# Patient Record
Sex: Female | Born: 1995 | Race: White | Hispanic: No | Marital: Single | Smoking: Never smoker
Health system: Southern US, Community
[De-identification: ages and names within clinical notes are randomized; demographics above are authoritative.]

## PROBLEM LIST (undated history)

## (undated) DIAGNOSIS — S022XXA Fracture of nasal bones, initial encounter for closed fracture: Secondary | ICD-10-CM

## (undated) DIAGNOSIS — Z862 Personal history of diseases of the blood and blood-forming organs and certain disorders involving the immune mechanism: Secondary | ICD-10-CM

## (undated) DIAGNOSIS — J189 Pneumonia, unspecified organism: Secondary | ICD-10-CM

## (undated) DIAGNOSIS — Z8774 Personal history of (corrected) congenital malformations of heart and circulatory system: Secondary | ICD-10-CM

## (undated) DIAGNOSIS — J45909 Unspecified asthma, uncomplicated: Secondary | ICD-10-CM

## (undated) DIAGNOSIS — R011 Cardiac murmur, unspecified: Secondary | ICD-10-CM

## (undated) DIAGNOSIS — S0121XA Laceration without foreign body of nose, initial encounter: Secondary | ICD-10-CM

## (undated) DIAGNOSIS — J329 Chronic sinusitis, unspecified: Secondary | ICD-10-CM

## (undated) HISTORY — PX: WISDOM TOOTH EXTRACTION: SHX21

---
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
## 2015-01-09 ENCOUNTER — Encounter (HOSPITAL_COMMUNITY): Payer: Self-pay | Admitting: Emergency Medicine

## 2015-01-09 ENCOUNTER — Emergency Department (INDEPENDENT_AMBULATORY_CARE_PROVIDER_SITE_OTHER)
Admission: EM | Admit: 2015-01-09 | Discharge: 2015-01-09 | Disposition: A | Discharge status: Home / Self Care | Payer: Managed Care, Other (non HMO) | Source: Home / Self Care | Attending: Emergency Medicine | Admitting: Emergency Medicine

## 2015-01-09 DIAGNOSIS — B084 Enteroviral vesicular stomatitis with exanthem: Secondary | ICD-10-CM | POA: Diagnosis not present

## 2015-01-09 LAB — CBC WITH DIFFERENTIAL/PLATELET
BASOS ABS: 0 10*3/uL (ref 0.0–0.1)
Basophils Relative: 0 % (ref 0–1)
EOS ABS: 0 10*3/uL (ref 0.0–0.7)
Eosinophils Relative: 0 % (ref 0–5)
HCT: 41.1 % (ref 36.0–46.0)
Hemoglobin: 13.7 g/dL (ref 12.0–15.0)
LYMPHS PCT: 11 % — AB (ref 12–46)
Lymphs Abs: 0.8 10*3/uL (ref 0.7–4.0)
MCH: 29.8 pg (ref 26.0–34.0)
MCHC: 33.3 g/dL (ref 30.0–36.0)
MCV: 89.5 fL (ref 78.0–100.0)
MONOS PCT: 4 % (ref 3–12)
Monocytes Absolute: 0.3 10*3/uL (ref 0.1–1.0)
Neutro Abs: 6 10*3/uL (ref 1.7–7.7)
Neutrophils Relative %: 85 % — ABNORMAL HIGH (ref 43–77)
PLATELETS: 277 10*3/uL (ref 150–400)
RBC: 4.59 MIL/uL (ref 3.87–5.11)
RDW: 12.5 % (ref 11.5–15.5)
WBC: 7 10*3/uL (ref 4.0–10.5)

## 2015-01-09 LAB — COMPREHENSIVE METABOLIC PANEL
ALK PHOS: 67 U/L (ref 38–126)
ALT: 15 U/L (ref 14–54)
ANION GAP: 12 (ref 5–15)
AST: 25 U/L (ref 15–41)
Albumin: 4.1 g/dL (ref 3.5–5.0)
BUN: 11 mg/dL (ref 6–20)
CHLORIDE: 100 mmol/L — AB (ref 101–111)
CO2: 27 mmol/L (ref 22–32)
Calcium: 9.8 mg/dL (ref 8.9–10.3)
Creatinine, Ser: 0.85 mg/dL (ref 0.44–1.00)
Glucose, Bld: 142 mg/dL — ABNORMAL HIGH (ref 65–99)
POTASSIUM: 4.3 mmol/L (ref 3.5–5.1)
SODIUM: 139 mmol/L (ref 135–145)
TOTAL PROTEIN: 7.9 g/dL (ref 6.5–8.1)
Total Bilirubin: 0.3 mg/dL (ref 0.3–1.2)

## 2015-01-09 LAB — POCT URINALYSIS DIP (DEVICE)
Bilirubin Urine: NEGATIVE
Glucose, UA: NEGATIVE mg/dL
KETONES UR: NEGATIVE mg/dL
LEUKOCYTES UA: NEGATIVE
NITRITE: NEGATIVE
Protein, ur: NEGATIVE mg/dL
SPECIFIC GRAVITY, URINE: 1.02 (ref 1.005–1.030)
Urobilinogen, UA: 1 mg/dL (ref 0.0–1.0)
pH: 6.5 (ref 5.0–8.0)

## 2015-01-09 LAB — POCT PREGNANCY, URINE: PREG TEST UR: NEGATIVE

## 2015-01-09 MED ORDER — LIDOCAINE VISCOUS 2 % MT SOLN: OROMUCOSAL | Status: DC

## 2015-01-09 NOTE — ED Notes (Signed)
Pt comes in with c/o pimple like rash all over with spreading that started yesterday  Pt was seen by PCP yesterday, treated with Atb's,Prednisone and Tylenol liquid for Tonsillitis  Seen by ENT yesterday as well Co sore throat,poor appetite, with chills and fever at home  Itchiness noted  Rapid strep negative @ doctors office

## 2015-01-09 NOTE — Discharge Instructions (Signed)
This is likely a virus, probably hand-foot-and-mouth. Please stop the Augmentin and prednisone. Use Benadryl as needed for itching. Use the viscous lidocaine as prescribed. Make sure you are drinking plenty of fluids. If you are unable to drink fluids or you develop a very stiff and painful neck, please go to the emergency room.  Hand, Foot, and Mouth Disease Hand, foot, and mouth disease is a common viral illness. It occurs mainly in children younger than 19 years of age, but adolescents and adults may also get it. This disease is different than foot and mouth disease that cattle, sheep, and pigs get. Most people are better in 1 week. CAUSES  Hand, foot, and mouth disease is usually caused by a group of viruses called enteroviruses. Hand, foot, and mouth disease can spread from person to person (contagious). A person is most contagious during the first week of the illness. It is not transmitted to or from pets or other animals. It is most common in the summer and early fall. Infection is spread from person to person by direct contact with an infected person's:  Nose discharge.  Throat discharge.  Stool. SYMPTOMS  Open sores (ulcers) occur in the mouth. Symptoms may also include:  A rash on the hands and feet, and occasionally the buttocks.  Fever.  Aches.  Pain from the mouth ulcers.  Fussiness. DIAGNOSIS  Hand, foot, and mouth disease is one of many infections that cause mouth sores. To be certain your child has hand, foot, and mouth disease your caregiver will diagnose your child by physical exam.Additional tests are not usually needed. TREATMENT  Nearly all patients recover without medical treatment in 7 to 10 days. There are no common complications. Your child should only take over-the-counter or prescription medicines for pain, discomfort, or fever as directed by your caregiver. Your caregiver may recommend the use of an over-the-counter antacid or a combination of an antacid  and diphenhydramine to help coat the lesions in the mouth and improve symptoms.  HOME CARE INSTRUCTIONS  Try combinations of foods to see what your child will tolerate and aim for a balanced diet. Soft foods may be easier to swallow. The mouth sores from hand, foot, and mouth disease typically hurt and are painful when exposed to salty, spicy, or acidic food or drinks.  Milk and cold drinks are soothing for some patients. Milk shakes, frozen ice pops, slushies, and sherberts are usually well tolerated.  Sport drinks are good choices for hydration, and they also provide a few calories. Often, a child with hand, foot, and mouth disease will be able to drink without discomfort.   For younger children and infants, feeding with a cup, spoon, or syringe may be less painful than drinking through the nipple of a bottle.  Keep children out of childcare programs, schools, or other group settings during the first few days of the illness or until they are without fever. The sores on the body are not contagious. SEEK IMMEDIATE MEDICAL CARE IF:  Your child develops signs of dehydration such as:  Decreased urination.  Dry mouth, tongue, or lips.  Decreased tears or sunken eyes.  Dry skin.  Rapid breathing.  Fussy behavior.  Poor color or pale skin.  Fingertips taking longer than 2 seconds to turn pink after a gentle squeeze.  Rapid weight loss.  Your child does not have adequate pain relief.  Your child develops a severe headache, stiff neck, or change in behavior.  Your child develops ulcers or blisters that  occur on the lips or outside of the mouth. Document Released: 04/30/2003 Document Revised: 10/24/2011 Document Reviewed: 01/13/2011 [facility] Patient Information 2015 [place], [place]. This information is not intended to replace advice given to you by your health care provider. Make sure you discuss any questions you have with your health care provider.

## 2015-01-09 NOTE — ED Provider Notes (Signed)
CSN: [id number]     Arrival date & time 01/09/15  1905 History   First MD Initiated Contact with Patient 01/09/15 1931     Chief Complaint  Patient presents with  . Sore Throat  . Rash   (Consider location/radiation/quality/duration/timing/severity/associated sxs/prior Treatment) HPI  She is an 19 year old woman here with her mother for evaluation of sore throat and rash. Her symptoms started 2 days ago with severe sore throat. She has also had fevers up to 102. She was seen by her primary care doctor yesterday. A rapid strep test was negative at that office. They referred her to her ear nose and throat specialist who treated her with Augmentin and prednisone. Today, she reports continued sore throat. It is bad enough that she is not eating or drinking very much. This has caused some lightheadedness and headache. No syncopal episodes. She has also developed a rash. The rash started yesterday, prior to any medications. Initially it was on her arm, but now involves hands, arms, legs, feet, face, and back. It is somewhat itchy. No stiff neck. She reports nausea, but no abdominal pain, vomiting, diarrhea. Fever responds well to Tylenol.  She has a history of mono.  Past Medical History  Diagnosis Date  . Asthma     prn inhaler  . History of ventricular septal defect     closed prior to age 89, per mother  . Heart murmur     functional murmur, per echo 04/2006  . Sinus infection 03/26/2014    will finish antibiotic 03/30/2014  . History of anemia     no current med.  . Closed fracture nasal bone 03/22/2014  . Laceration of nose 03/22/2014    x 3 - sutured   Past Surgical History  Procedure Laterality Date  . Closed reduction nasal fracture N/A 03/31/2014    Procedure: CLOSED REDUCTION NASAL FRACTURE;  Surgeon: Glenna FellowsBrinda Thimmappa, MD;  Location: [facility];  Service: Plastics;  Laterality: N/A;   Family History  Problem Relation Age of Onset  . Hypertension Maternal Grandmother   .  Congestive Heart Failure Maternal Grandmother   . Asthma Maternal Grandmother   . Stroke Maternal Grandmother   . Hypertension Paternal Grandmother    History  Substance Use Topics  . Smoking status: Never Smoker   . Smokeless tobacco: Never Used  . Alcohol Use: No   OB History    No data available     Review of Systems As in history of present illness Allergies  Review of patient's allergies indicates no known allergies.  Home Medications   Prior to Admission medications   Medication Sig Start Date End Date Taking? Authorizing Provider  acetaminophen (TYLENOL) 325 MG tablet Take 650 mg by mouth every 6 (six) hours as needed.    Historical Provider, MD  albuterol (PROVENTIL HFA;VENTOLIN HFA) 108 (90 BASE) MCG/ACT inhaler Inhale 1 puff into the lungs every 6 (six) hours as needed for wheezing or shortness of breath.    Historical Provider, MD  ibuprofen (ADVIL,MOTRIN) 200 MG tablet Take 200 mg by mouth every 6 (six) hours as needed.    Historical Provider, MD  lidocaine (XYLOCAINE) 2 % solution Swish and gargle with 5mLs for 10-20 seconds, then spit.  You can do this every 3-4 hours as needed for mouth pain. 01/09/15   Charm RingsErin J Elaijah Munoz, MD  norgestimate-ethinyl estradiol (ORTHO-CYCLEN,SPRINTEC,PREVIFEM) 0.25-35 MG-MCG tablet Take 1 tablet by mouth daily.    Historical Provider, MD   BP 127/78 mmHg  Pulse 64  Temp(Src) 96.6 F (35.9 C) (Oral)  Resp 16  SpO2 100%  LMP 01/06/2015 Physical Exam  Constitutional: She is oriented to person, place, and time. She appears well-developed and well-nourished. No distress.  HENT:  Mouth/Throat: No oropharyngeal exudate.  She has several shallow ulcerations on the posterior pharynx, primarily on the tonsillar pillars.  She has significant erythema of the tonsils and posterior pharynx.  Eyes: Conjunctivae are normal.  Neck: Neck supple.  Cardiovascular: Normal rate and regular rhythm.   Murmur (known history of murmur) heard. Pulmonary/Chest:  Effort normal and breath sounds normal. No respiratory distress. She has no wheezes. She has no rales.  Abdominal: Soft. Bowel sounds are normal. She exhibits no distension. There is no tenderness. There is no rebound and no guarding.  Lymphadenopathy:    She has no cervical adenopathy.  Neurological: She is alert and oriented to person, place, and time.  Skin:  Diffuse erythematous papules and vesicles on hands, feet, arms, legs. She has several spots on her nose.    ED Course  Procedures (including critical care time) Labs Review Labs Reviewed  POCT URINALYSIS DIP (DEVICE) - Abnormal; Notable for the following:    Hgb urine dipstick MODERATE (*)    All other components within normal limits  CBC WITH DIFFERENTIAL/PLATELET  COMPREHENSIVE METABOLIC PANEL  POCT PREGNANCY, URINE    Imaging Review No results found.   MDM   1. Hand, foot and mouth disease    History and exam is most consistent with hand-foot-and-mouth disease. Recommended stopping the antibiotic-impregnated prednisone. Viscous lidocaine for mouth pain. Benadryl as needed for itching. Discussed importance of fluid intake. She is having some symptoms of mild dehydration, however her mucosa is moist and her urine specific gravity is 1.020. A CBC and CMP were collected. I will call with results. Return precautions reviewed as in after visit summary.    Charm Rings, MD 01/09/15 2006

## 2015-07-04 ENCOUNTER — Emergency Department (HOSPITAL_COMMUNITY): Payer: Managed Care, Other (non HMO)

## 2015-07-04 ENCOUNTER — Encounter (HOSPITAL_COMMUNITY): Payer: Self-pay | Admitting: Emergency Medicine

## 2015-07-04 ENCOUNTER — Emergency Department (HOSPITAL_COMMUNITY)
Admission: EM | Admit: 2015-07-04 | Discharge: 2015-07-04 | Disposition: A | Discharge status: Home / Self Care | Payer: Managed Care, Other (non HMO) | Attending: Emergency Medicine | Admitting: Emergency Medicine

## 2015-07-04 DIAGNOSIS — Z8679 Personal history of other diseases of the circulatory system: Secondary | ICD-10-CM | POA: Diagnosis not present

## 2015-07-04 DIAGNOSIS — Z7951 Long term (current) use of inhaled steroids: Secondary | ICD-10-CM | POA: Diagnosis not present

## 2015-07-04 DIAGNOSIS — Z8701 Personal history of pneumonia (recurrent): Secondary | ICD-10-CM | POA: Insufficient documentation

## 2015-07-04 DIAGNOSIS — J45901 Unspecified asthma with (acute) exacerbation: Secondary | ICD-10-CM | POA: Diagnosis not present

## 2015-07-04 DIAGNOSIS — R011 Cardiac murmur, unspecified: Secondary | ICD-10-CM | POA: Insufficient documentation

## 2015-07-04 DIAGNOSIS — Z862 Personal history of diseases of the blood and blood-forming organs and certain disorders involving the immune mechanism: Secondary | ICD-10-CM | POA: Insufficient documentation

## 2015-07-04 DIAGNOSIS — Z8781 Personal history of (healed) traumatic fracture: Secondary | ICD-10-CM | POA: Insufficient documentation

## 2015-07-04 DIAGNOSIS — Z87828 Personal history of other (healed) physical injury and trauma: Secondary | ICD-10-CM | POA: Insufficient documentation

## 2015-07-04 DIAGNOSIS — Z79899 Other long term (current) drug therapy: Secondary | ICD-10-CM | POA: Insufficient documentation

## 2015-07-04 DIAGNOSIS — R05 Cough: Secondary | ICD-10-CM | POA: Diagnosis present

## 2015-07-04 HISTORY — DX: Pneumonia, unspecified organism: J18.9

## 2015-07-04 MED ORDER — ALBUTEROL SULFATE (2.5 MG/3ML) 0.083% IN NEBU
5.0000 mg | INHALATION_SOLUTION | Freq: Once | RESPIRATORY_TRACT | Status: AC
  Administered 2015-07-04: 5 mg via RESPIRATORY_TRACT
  Filled 2015-07-04: qty 6

## 2015-07-04 MED ORDER — ALBUTEROL (5 MG/ML) CONTINUOUS INHALATION SOLN
10.0000 mg/h | INHALATION_SOLUTION | RESPIRATORY_TRACT | Status: DC
  Administered 2015-07-04: 10 mg/h via RESPIRATORY_TRACT
  Filled 2015-07-04: qty 20

## 2015-07-04 MED ORDER — BENZONATATE 100 MG PO CAPS: 100.0000 mg | ORAL_CAPSULE | Freq: Three times a day (TID) | ORAL | Status: DC | PRN

## 2015-07-04 MED ORDER — ALBUTEROL SULFATE (2.5 MG/3ML) 0.083% IN NEBU: 2.5000 mg | INHALATION_SOLUTION | Freq: Four times a day (QID) | RESPIRATORY_TRACT | Status: AC | PRN

## 2015-07-04 MED ORDER — IPRATROPIUM BROMIDE 0.02 % IN SOLN
0.5000 mg | Freq: Once | RESPIRATORY_TRACT | Status: AC
  Administered 2015-07-04: 0.5 mg via RESPIRATORY_TRACT
  Filled 2015-07-04: qty 2.5

## 2015-07-04 MED ORDER — DEXAMETHASONE 10 MG/ML FOR PEDIATRIC ORAL USE
10.0000 mg | Freq: Once | INTRAMUSCULAR | Status: AC
  Administered 2015-07-04: 10 mg via ORAL
  Filled 2015-07-04: qty 1

## 2015-07-04 NOTE — Discharge Instructions (Signed)
Asthma Attack Prevention °While you may not be able to control the fact that you have asthma, you can take actions to prevent asthma attacks. The best way to prevent asthma attacks is to maintain good control of your asthma. You can achieve this by: °· Taking your medicines as directed. °· Avoiding things that can irritate your airways or make your asthma symptoms worse (asthma triggers). °· Keeping track of how well your asthma is controlled and of any changes in your symptoms. °· Responding quickly to worsening asthma symptoms (asthma attack). °· Seeking emergency care when it is needed. °WHAT ARE SOME WAYS TO PREVENT AN ASTHMA ATTACK? °Have a Plan °Work with your health care provider to create a written plan for managing and treating your asthma attacks (asthma action plan). This plan includes: °· A list of your asthma triggers and how you can avoid them. °· Information on when medicines should be taken and when their dosages should be changed. °· The use of a device that measures how well your lungs are working (peak flow meter). °Monitor Your Asthma °Use your peak flow meter and record your results in a journal every day. A drop in your peak flow numbers on one or more days may indicate the start of an asthma attack. This can happen even before you start to feel symptoms. You can prevent an asthma attack from getting worse by following the steps in your asthma action plan. °Avoid Asthma Triggers °Work with your asthma health care provider to find out what your asthma triggers are. This can be done by: °· Allergy testing. °· Keeping a journal that notes when asthma attacks occur and the factors that may have contributed to them. °· Determining if there are other medical conditions that are making your asthma worse. °Once you have determined your asthma triggers, take steps to avoid them. This may include avoiding excessive or prolonged exposure to: °· Dust. Have someone dust and vacuum your home for you once or  twice a week. Using a high-efficiency particulate arrestance (HEPA) vacuum is best. °· Smoke. This includes campfire smoke, forest fire smoke, and secondhand smoke from tobacco products. °· Pet dander. Avoid contact with animals that you know you are allergic to. °· Allergens from trees, grasses or pollens. Avoid spending a lot of time outdoors when pollen counts are high, and on very windy days. °· Very cold, dry, or humid air. °· Mold. °· Foods that contain high amounts of sulfites. °· Strong odors. °· Outdoor air pollutants, such as engine exhaust. °· Indoor air pollutants, such as aerosol sprays and fumes from household cleaners. °· Household pests, including dust mites and cockroaches, and pest droppings. °· Certain medicines, including NSAIDs. Always talk to your health care provider before stopping or starting any new medicines. °Medicines °Take over-the-counter and prescription medicines only as told by your health care provider. Many asthma attacks can be prevented by carefully following your medicine schedule. Taking your medicines correctly is especially important when you cannot avoid certain asthma triggers. °Act Quickly °If an asthma attack does happen, acting quickly can decrease how severe it is and how long it lasts. Take these steps:  °· Pay attention to your symptoms. If you are coughing, wheezing, or having difficulty breathing, do not wait to see if your symptoms go away on their own. Follow your asthma action plan. °· If you have followed your asthma action plan and your symptoms are not improving, call your health care provider or seek immediate medical care   at the nearest hospital. °It is important to note how often you need to use your fast-acting rescue inhaler. If you are using your rescue inhaler more often, it may mean that your asthma is not under control. Adjusting your asthma treatment plan may help you to prevent future asthma attacks and help you to gain better control of your  condition. °HOW CAN I PREVENT AN ASTHMA ATTACK WHEN I EXERCISE? °Follow advice from your health care provider about whether you should use your fast-acting inhaler before exercising. Many people with asthma experience exercise-induced bronchoconstriction (EIB). This condition often worsens during vigorous exercise in cold, humid, or dry environments. Usually, people with EIB can stay very active by pre-treating with a fast-acting inhaler before exercising. °  °This information is not intended to replace advice given to you by your health care provider. Make sure you discuss any questions you have with your health care provider. °  °Document Released: 07/20/2009 Document Revised: 04/22/2015 Document Reviewed: 01/01/2015 °Elsevier Interactive Patient Education ©2016 Else[employer] Inc. ° °Asthma, Acute Bronchospasm °Acute bronchospasm caused by asthma is also referred to as an asthma attack. Bronchospasm means your air passages become narrowed. The narrowing is caused by inflammation and tightening of the muscles in the air tubes (bronchi) in your lungs. This can make it hard to breathe or cause you to wheeze and cough. °CAUSES °Possible triggers are: °· Animal dander from the skin, hair, or feathers of animals. °· Dust mites contained in house dust. °· Cockroaches. °· Pollen from trees or grass. °· Mold. °· Cigarette or tobacco smoke. °· Air pollutants such as dust, household cleaners, hair sprays, aerosol sprays, paint fumes, strong chemicals, or strong odors. °· Cold air or weather changes. Cold air may trigger inflammation. Winds increase molds and pollens in the air. °· Strong emotions such as crying or laughing hard. °· Stress. °· Certain medicines such as aspirin or beta-blockers. °· Sulfites in foods and drinks, such as dried fruits and wine. °· Infections or inflammatory conditions, such as a flu, cold, or inflammation of the nasal membranes (rhinitis). °· Gastroesophageal reflux disease (GERD). GERD is a condition  where stomach acid backs up into your esophagus. °· Exercise or strenuous activity. °SIGNS AND SYMPTOMS  °· Wheezing. °· Excessive coughing, particularly at night. °· Chest tightness. °· Shortness of breath. °DIAGNOSIS  °Your health care provider will ask you about your medical history and perform a physical exam. A chest X-ray or blood testing may be performed to look for other causes of your symptoms or other conditions that may have triggered your asthma attack.  °TREATMENT  °Treatment is aimed at reducing inflammation and opening up the airways in your lungs.  Most asthma attacks are treated with inhaled medicines. These include quick relief or rescue medicines (such as bronchodilators) and controller medicines (such as inhaled corticosteroids). These medicines are sometimes given through an inhaler or a nebulizer. Systemic steroid medicine taken by mouth or given through an IV tube also can be used to reduce the inflammation when an attack is moderate or severe. Antibiotic medicines are only used if a bacterial infection is present.  °HOME CARE INSTRUCTIONS  °· Rest. °· Drink plenty of liquids. This helps the mucus to remain thin and be easily coughed up. Only use caffeine in moderation and do not use alcohol until you have recovered from your illness. °· Do not smoke. Avoid being exposed to secondhand smoke. °· You play a critical role in keeping yourself in good health. Avoid exposure to things that   cause you to wheeze or to have breathing problems.  Keep your medicines up-to-date and available. Carefully follow your health care provider's treatment plan.  Take your medicine exactly as prescribed.  When pollen or pollution is bad, keep windows closed and use an air conditioner or go to places with air conditioning.  Asthma requires careful medical care. See your health care provider for a follow-up as advised. If you are more than [redacted] weeks pregnant and you were prescribed any new medicines, let your  obstetrician know about the visit and how you are doing. Follow up with your health care provider as directed.  After you have recovered from your asthma attack, make an appointment with your outpatient doctor to talk about ways to reduce the likelihood of future attacks. If you do not have a doctor who manages your asthma, make an appointment with a primary care doctor to discuss your asthma. SEEK IMMEDIATE MEDICAL CARE IF:   You are getting worse.  You have trouble breathing. If severe, call your local emergency services (911 in the [place]  You develop chest pain or discomfort.  You are vomiting.  You are not able to keep fluids down.  You are coughing up yellow, green, brown, or bloody sputum.  You have a fever and your symptoms suddenly get worse.  You have trouble swallowing. MAKE SURE YOU:   Understand these instructions.  Will watch your condition.  Will get help right away if you are not doing well or get worse.   This information is not intended to replace advice given to you by your health care provider. Make sure you discuss any questions you have with your health care provider.   Document Released: 11/16/2006 Document Revised: 08/06/2013 Document Reviewed: 02/06/2013 Elsevier Interactive Patient Education 2016 Else[employer] Inc[employer] Acute Bronchitis Bronchitis is inflammation of the airways that extend from the windpipe into the lungs (bronchi). The inflammation often causes mucus to develop. This leads to a cough, which is the most common symptom of bronchitis.  In acute bronchitis, the condition usually develops suddenly and goes away over time, usually in a couple weeks. Smoking, allergies, and asthma can make bronchitis worse. Repeated episodes of bronchitis may cause further lung problems.  CAUSES Acute bronchitis is most often caused by the same virus that causes a cold. The virus can spread from person to person (contagious) through coughing, sneezing, and touching  contaminated objects. SIGNS AND SYMPTOMS   Cough.   Fever.   Coughing up mucus.   Body aches.   Chest congestion.   Chills.   Shortness of breath.   Sore throat.  DIAGNOSIS  Acute bronchitis is usually diagnosed through a physical exam. Your health care provider will also ask you questions about your medical history. Tests, such as chest X-rays, are sometimes done to rule out other conditions.  TREATMENT  Acute bronchitis usually goes away in a couple weeks. Oftentimes, no medical treatment is necessary. Medicines are sometimes given for relief of fever or cough. Antibiotic medicines are usually not needed but may be prescribed in certain situations. In some cases, an inhaler may be recommended to help reduce shortness of breath and control the cough. A cool mist vaporizer may also be used to help thin bronchial secretions and make it easier to clear the chest.  HOME CARE INSTRUCTIONS  Get plenty of rest.   Drink enough fluids to keep your urine clear or pale yellow (unless you have a medical condition that requires fluid restriction). Increasing fluids may  help thin your respiratory secretions (sputum) and reduce chest congestion, and it will prevent dehydration.   Take medicines only as directed by your health care provider.  If you were prescribed an antibiotic medicine, finish it all even if you start to feel better.  Avoid smoking and secondhand smoke. Exposure to cigarette smoke or irritating chemicals will make bronchitis worse. If you are a smoker, consider using nicotine gum or skin patches to help control withdrawal symptoms. Quitting smoking will help your lungs heal faster.   Reduce the chances of another bout of acute bronchitis by washing your hands frequently, avoiding people with cold symptoms, and trying not to touch your hands to your mouth, nose, or eyes.   Keep all follow-up visits as directed by your health care provider.  SEEK MEDICAL CARE  IF: Your symptoms do not improve after 1 week of treatment.  SEEK IMMEDIATE MEDICAL CARE IF:  You develop an increased fever or chills.   You have chest pain.   You have severe shortness of breath.  You have bloody sputum.   You develop dehydration.  You faint or repeatedly feel like you are going to pass out.  You develop repeated vomiting.  You develop a severe headache. MAKE SURE YOU:   Understand these instructions.  Will watch your condition.  Will get help right away if you are not doing well or get worse.   This information is not intended to replace advice given to you by your health care provider. Make sure you discuss any questions you have with your health care provider.   Document Released: 09/08/2004 Document Revised: 08/22/2014 Document Reviewed: 01/22/2013 Elsevier Interactive Patient Education [employer] Inc[employer]

## 2015-07-04 NOTE — ED Notes (Addendum)
Pt c/o shortness, dizziness, wheezing, productive cough. Tx 7 days ago with prednisone,singular Advair.Albuterol."CXR= bronchitis". Z-pack started 4 days ago. Hx of recurrent asthma, followed by recurrent pneumonia

## 2015-07-04 NOTE — ED Notes (Signed)
RN called respiratory for nebulizer tx

## 2015-07-04 NOTE — ED Provider Notes (Signed)
CSN: [id number]     Arrival date & time 07/04/15  1316 History   First MD Initiated Contact with Patient 07/04/15 1323     Chief Complaint  Patient presents with  . Shortness of Breath    10 day hx  . Asthma    hx  . Dizziness  . Cough   Jaime Wiggins is a 19 y.o. female with a history of asthma, and pneumonia who presents to the emergency department complaining of cough, chest pain, wheezing, shortness of breath and productive cough for the past week and a half. Patient was seen at her student health clinic about a week ago and was treated with prednisone, albuterol inhaler and Advair. Patient was diagnosed with bronchitis. She reports she continued to feel worse and was started on a Z-Pak 4 days ago. She has 2 doses left of her Z-Pak. Patient reports she completed prednisone 4 days ago. Patient has been taking albuterol inhaler every 4 hours. Also has Advair,  And azithromycin. She reports he is taking Singulair at night. She reports she still having productive cough with white sputum. She reports runny nose, nasal congestion, postnasal drip sneezing as well. Patient reports chills but no fevers. The patient denies abdominal pain, vomiting, diarrhea, body aches, rashes, syncope.  (Consider location/radiation/quality/duration/timing/severity/associated sxs/prior Treatment) HPI  Past Medical History  Diagnosis Date  . Asthma     prn inhaler  . History of ventricular septal defect     closed prior to age 19, per mother  . Heart murmur     functional murmur, per echo 04/2006  . Sinus infection 03/26/2014    will finish antibiotic 03/30/2014  . History of anemia     no current med.  . Closed fracture nasal bone 03/22/2014  . Laceration of nose 03/22/2014    x 3 - sutured  . Pneumonia    Past Surgical History  Procedure Laterality Date  . Closed reduction nasal fracture N/A 03/31/2014    Procedure: CLOSED REDUCTION NASAL FRACTURE;  Surgeon: Glenna FellowsBrinda Thimmappa, MD;  Location: [facility];  Service: Plastics;  Laterality: N/A;  . Wisdom tooth extraction     Family History  Problem Relation Age of Onset  . Hypertension Maternal Grandmother   . Congestive Heart Failure Maternal Grandmother   . Asthma Maternal Grandmother   . Stroke Maternal Grandmother   . Hypertension Paternal Grandmother   . Asthma Mother    Social History  Substance Use Topics  . Smoking status: Never Smoker   . Smokeless tobacco: Never Used  . Alcohol Use: No   OB History    No data available     Review of Systems  Constitutional: Positive for chills. Negative for fever.  HENT: Positive for congestion, postnasal drip, rhinorrhea, sneezing and sore throat. Negative for ear pain, nosebleeds and trouble swallowing.   Eyes: Negative for visual disturbance.  Respiratory: Positive for cough, chest tightness, shortness of breath and wheezing.   Cardiovascular: Positive for chest pain. Negative for palpitations and leg swelling.  Gastrointestinal: Negative for vomiting, abdominal pain and diarrhea.  Musculoskeletal: Negative for myalgias, arthralgias and neck pain.  Skin: Negative for rash and wound.  Neurological: Negative for syncope, weakness and headaches.      Allergies  Review of patient's allergies indicates no known allergies.  Home Medications   Prior to Admission medications   Medication Sig Start Date End Date Taking? Authorizing Provider  albuterol (PROVENTIL HFA;VENTOLIN HFA) 108 (90 BASE) MCG/ACT inhaler Inhale 1  puff into the lungs every 6 (six) hours as needed for wheezing or shortness of breath.   Yes Historical Provider, MD  azithromycin (ZITHROMAX) 250 MG tablet Take 250-500 mg by mouth. Zpak 5 day instructions.   Yes Historical Provider, MD  FALMINA 0.1-20 MG-MCG tablet Take 1 tablet by mouth at bedtime. 06/17/15  Yes Historical Provider, MD  Fluticasone-Salmeterol (ADVAIR) 250-50 MCG/DOSE AEPB Inhale 1 puff into the lungs 2 (two) times daily.   Yes Historical  Provider, MD  montelukast (SINGULAIR) 10 MG tablet Take 10 mg by mouth at bedtime.   Yes Historical Provider, MD  acetaminophen (TYLENOL) 325 MG tablet Take 650 mg by mouth every 6 (six) hours as needed.    Historical Provider, MD  albuterol (PROVENTIL) (2.5 MG/3ML) 0.083% nebulizer solution Take 3 mLs (2.5 mg total) by nebulization every 6 (six) hours as needed for wheezing or shortness of breath. 07/04/15   Everlene Farrier, PA-C  benzonatate (TESSALON) 100 MG capsule Take 1 capsule (100 mg total) by mouth 3 (three) times daily as needed for cough. 07/04/15   Everlene Farrier, PA-C   BP 132/85 mmHg  Pulse 119  Temp(Src) 98.2 F (36.8 C) (Oral)  Resp 16  SpO2 100%  LMP 06/27/2015 (Exact Date) Physical Exam  Constitutional: She is oriented to person, place, and time. She appears well-developed and well-nourished. No distress.  Nontoxic appearing.  HENT:  Head: Normocephalic and atraumatic.  Right Ear: External ear normal.  Left Ear: External ear normal.  Mouth/Throat: Oropharynx is clear and moist. No oropharyngeal exudate.  Boggy nasal turbinates bilaterally.Mild bilateral tonsillar hypertrophy without exudates. No posterior oral pharyngeal edema. Uvula is midline without edema. No peritonsillar abscess. Bilateral tympanic membranes are pearly-gray without erythema or loss of landmarks.   Eyes: Conjunctivae are normal. Pupils are equal, round, and reactive to light. Right eye exhibits no discharge. Left eye exhibits no discharge.  Neck: Normal range of motion. Neck supple. No JVD present. No tracheal deviation present.  Cardiovascular: Normal rate, regular rhythm, normal heart sounds and intact distal pulses.  Exam reveals no gallop and no friction rub.   Heart rate is 96.  Pulmonary/Chest: Effort normal. No respiratory distress. She has wheezes. She has no rales. She exhibits tenderness.  Patient has wheezing in her bilateral bases worse on her left side. No respiratory distress. Patient  speaking in complete sentences. Symmetric chest expansion bilaterally. Patient's substernal chest is tender to palpation reproduces her chest pain.  Abdominal: Soft. She exhibits no distension. There is no tenderness. There is no guarding.  Musculoskeletal: She exhibits no edema or tenderness.  No lower extremity edema or tenderness.  Lymphadenopathy:    She has no cervical adenopathy.  Neurological: She is alert and oriented to person, place, and time. Coordination normal.  Skin: Skin is warm and dry. No rash noted. She is not diaphoretic. No erythema. No pallor.  Psychiatric: She has a normal mood and affect. Her behavior is normal.  Nursing note and vitals reviewed.   ED Course  Procedures (including critical care time) Labs Review Labs Reviewed - No data to display  Imaging Review Dg Chest 2 View  07/04/2015  CLINICAL DATA:  Shortness of breath, dizziness and wheezing. Productive cough EXAM: CHEST  2 VIEW COMPARISON:  9/14/8 FINDINGS: The heart size and mediastinal contours are within normal limits. Both lungs are clear. The visualized skeletal structures are unremarkable. IMPRESSION: No active cardiopulmonary disease. Electronically Signed   By: Signa Kell M.D.   On: 07/04/2015 14:14  I have personally reviewed and evaluated these images as part of my medical decision-making.   EKG Interpretation None      Filed Vitals:   07/04/15 1324 07/04/15 1536 07/04/15 1541  BP: 130/76 132/85   Pulse: 95 119   Temp:  98.2 F (36.8 C)   TempSrc: Oral Oral   Resp:  16   SpO2: 98% 99% 100%     MDM   Meds given in ED:  Medications  albuterol (PROVENTIL,VENTOLIN) solution continuous neb (10 mg/hr Nebulization New Bag/Given 07/04/15 1636)  albuterol (PROVENTIL) (2.5 MG/3ML) 0.083% nebulizer solution 5 mg (5 mg Nebulization Given 07/04/15 1325)  ipratropium (ATROVENT) nebulizer solution 0.5 mg (0.5 mg Nebulization Given 07/04/15 1636)  dexamethasone (DECADRON) 10 MG/ML  injection for Pediatric ORAL use 10 mg (10 mg Oral Given 07/04/15 1608)    New Prescriptions   ALBUTEROL (PROVENTIL) (2.5 MG/3ML) 0.083% NEBULIZER SOLUTION    Take 3 mLs (2.5 mg total) by nebulization every 6 (six) hours as needed for wheezing or shortness of breath.   BENZONATATE (TESSALON) 100 MG CAPSULE    Take 1 capsule (100 mg total) by mouth 3 (three) times daily as needed for cough.    Final diagnoses:  Asthma exacerbation   This is a 19 y.o. female with a history of asthma, and pneumonia who presents to the emergency department complaining of cough, chest pain, wheezing, shortness of breath and productive cough for the past week and a half. Patient was seen at her student health clinic about a week ago and was treated with prednisone, albuterol inhaler and Advair. Patient was diagnosed with bronchitis. She reports she continued to feel worse and was started on a Z-Pak 4 days ago. She has 2 doses left of her Z-Pak. Patient reports she completed prednisone 4 days ago. On exam the patient is afebrile nontoxic appearing. She has wheezes noted to her bilateral bases. She is in no respiratory distress. Her chest is tender to palpation and reproduces her chest pain. I doubt PE as she has symptoms of asthma exacerbation with associated reproducible chest pain. Patient also has an oxygen saturation 100% on room air. Patient's chest x-ray is unremarkable. Patient is provided with initial albuterol treatment initially. Patient is still feeling slightly short of breath. Will provide with continuous nebulization treatment as well as Decadron orally. I re-evaluation patient reports she is feeling much better. She reports she no longer feels short of breath and feels she is ready for discharge. Repeat lung exam is much improved and there is no wheezing noted on exam. We'll discharge with prescription for refill on her albuterol nebulizer solution as well as Tessalon Perles for cough. I encouraged close  follow-up by primary care and advised strict return precautions. I advised the patient to follow-up with their primary care provider this week. I advised the patient to return to the emergency department with new or worsening symptoms or new concerns. The patient and her mother verbalized understanding and agreement with plan.     Everlene Farrier, PA-C 07/04/15 1759  Benjiman Core, MD 07/04/15 [phone]

## 2015-07-08 ENCOUNTER — Other Ambulatory Visit (HOSPITAL_COMMUNITY): Payer: Self-pay | Admitting: Plastic Surgery

## 2015-07-08 DIAGNOSIS — J3489 Other specified disorders of nose and nasal sinuses: Secondary | ICD-10-CM

## 2015-07-08 DIAGNOSIS — R0689 Other abnormalities of breathing: Secondary | ICD-10-CM

## 2015-08-03 ENCOUNTER — Ambulatory Visit (HOSPITAL_COMMUNITY): Admission: RE | Admit: 2015-08-03 | Payer: Managed Care, Other (non HMO) | Source: Ambulatory Visit

## 2015-08-09 ENCOUNTER — Emergency Department (HOSPITAL_COMMUNITY)
Admission: EM | Admit: 2015-08-09 | Discharge: 2015-08-10 | Disposition: A | Discharge status: Home / Self Care | Payer: Managed Care, Other (non HMO) | Attending: Emergency Medicine | Admitting: Emergency Medicine

## 2015-08-09 ENCOUNTER — Encounter (HOSPITAL_COMMUNITY): Payer: Self-pay | Admitting: Emergency Medicine

## 2015-08-09 DIAGNOSIS — J039 Acute tonsillitis, unspecified: Secondary | ICD-10-CM | POA: Diagnosis not present

## 2015-08-09 DIAGNOSIS — Z3202 Encounter for pregnancy test, result negative: Secondary | ICD-10-CM | POA: Diagnosis not present

## 2015-08-09 DIAGNOSIS — Q210 Ventricular septal defect: Secondary | ICD-10-CM | POA: Diagnosis not present

## 2015-08-09 DIAGNOSIS — Z79899 Other long term (current) drug therapy: Secondary | ICD-10-CM | POA: Diagnosis not present

## 2015-08-09 DIAGNOSIS — R011 Cardiac murmur, unspecified: Secondary | ICD-10-CM | POA: Insufficient documentation

## 2015-08-09 DIAGNOSIS — Z87828 Personal history of other (healed) physical injury and trauma: Secondary | ICD-10-CM | POA: Insufficient documentation

## 2015-08-09 DIAGNOSIS — Z7951 Long term (current) use of inhaled steroids: Secondary | ICD-10-CM | POA: Diagnosis not present

## 2015-08-09 DIAGNOSIS — Z862 Personal history of diseases of the blood and blood-forming organs and certain disorders involving the immune mechanism: Secondary | ICD-10-CM | POA: Insufficient documentation

## 2015-08-09 DIAGNOSIS — Z8781 Personal history of (healed) traumatic fracture: Secondary | ICD-10-CM | POA: Insufficient documentation

## 2015-08-09 DIAGNOSIS — A084 Viral intestinal infection, unspecified: Secondary | ICD-10-CM | POA: Diagnosis not present

## 2015-08-09 DIAGNOSIS — J45909 Unspecified asthma, uncomplicated: Secondary | ICD-10-CM | POA: Insufficient documentation

## 2015-08-09 DIAGNOSIS — M549 Dorsalgia, unspecified: Secondary | ICD-10-CM | POA: Insufficient documentation

## 2015-08-09 DIAGNOSIS — Z792 Long term (current) use of antibiotics: Secondary | ICD-10-CM | POA: Insufficient documentation

## 2015-08-09 DIAGNOSIS — R1084 Generalized abdominal pain: Secondary | ICD-10-CM | POA: Diagnosis present

## 2015-08-09 LAB — COMPREHENSIVE METABOLIC PANEL
ALBUMIN: 4.2 g/dL (ref 3.5–5.0)
ALK PHOS: 55 U/L (ref 38–126)
ALT: 15 U/L (ref 14–54)
AST: 23 U/L (ref 15–41)
Anion gap: 12 (ref 5–15)
BUN: 11 mg/dL (ref 6–20)
CALCIUM: 9.1 mg/dL (ref 8.9–10.3)
CHLORIDE: 100 mmol/L — AB (ref 101–111)
CO2: 22 mmol/L (ref 22–32)
CREATININE: 0.78 mg/dL (ref 0.44–1.00)
GFR calc non Af Amer: >60 mL/min (ref >60)
GLUCOSE: 123 mg/dL — AB (ref 65–99)
Potassium: 3.5 mmol/L (ref 3.5–5.1)
SODIUM: 134 mmol/L — AB (ref 135–145)
Total Bilirubin: 1.1 mg/dL (ref 0.3–1.2)
Total Protein: 7.6 g/dL (ref 6.5–8.1)

## 2015-08-09 LAB — CBC
HEMATOCRIT: 42.9 % (ref 36.0–46.0)
HEMOGLOBIN: 14.6 g/dL (ref 12.0–15.0)
MCH: 30 pg (ref 26.0–34.0)
MCHC: 34 g/dL (ref 30.0–36.0)
MCV: 88.3 fL (ref 78.0–100.0)
Platelets: 278 10*3/uL (ref 150–400)
RBC: 4.86 MIL/uL (ref 3.87–5.11)
RDW: 12.3 % (ref 11.5–15.5)
WBC: 6.9 10*3/uL (ref 4.0–10.5)

## 2015-08-09 LAB — URINALYSIS, ROUTINE W REFLEX MICROSCOPIC
Bilirubin Urine: NEGATIVE
Glucose, UA: NEGATIVE mg/dL
HGB URINE DIPSTICK: NEGATIVE
KETONES UR: NEGATIVE mg/dL
Leukocytes, UA: NEGATIVE
Nitrite: NEGATIVE
PROTEIN: NEGATIVE mg/dL
Specific Gravity, Urine: 1.005 (ref 1.005–1.030)
pH: 6 (ref 5.0–8.0)

## 2015-08-09 LAB — I-STAT BETA HCG BLOOD, ED (MC, WL, AP ONLY)

## 2015-08-09 LAB — LIPASE, BLOOD: LIPASE: 28 U/L (ref 11–51)

## 2015-08-09 MED ORDER — ONDANSETRON HCL 4 MG/2ML IJ SOLN
4.0000 mg | Freq: Once | INTRAMUSCULAR | Status: AC
  Administered 2015-08-09: 4 mg via INTRAVENOUS
  Filled 2015-08-09: qty 2

## 2015-08-09 MED ORDER — ONDANSETRON 4 MG PO TBDP: 4.0000 mg | ORAL_TABLET | Freq: Once | ORAL | Status: DC | PRN

## 2015-08-09 MED ORDER — MORPHINE SULFATE (PF) 4 MG/ML IV SOLN
4.0000 mg | Freq: Once | INTRAVENOUS | Status: AC
  Administered 2015-08-09: 4 mg via INTRAVENOUS
  Filled 2015-08-09: qty 1

## 2015-08-09 NOTE — ED Provider Notes (Signed)
CSN: [id number]     Arrival date & time 08/09/15  2028 History   First MD Initiated Contact with Patient 08/09/15 2109     Chief Complaint  Patient presents with  . Abdominal Pain     (Consider location/radiation/quality/duration/timing/severity/associated sxs/prior Treatment) HPI   Patient is a 19 year old female past medical history of asthma who presents to the ED with complaint of abdominal pain, onset last night. Patient reports having worsening constant aching/sharp diffuse abdominal pain, she notes the pain is worse in her right lower quadrant. Endorses associated chills, fever, myalgias, neck/back pain, weakness, nausea, NBNB vomiting and diarrhea. Patient endorses seen a small amount of blood in her bowel movement. Patient reports she was seen at the clinic on campus at her college week ago and was diagnosed with tonsillitis but notes she did not take her full course of antibiotics. She notes she started to have a sore throat again 3 days ago. Denies dysphasia, drooling, muffled voice, facial/neck swelling. Patient states she has tried taking Tums and Imodium at home but has been unable to keep medicine down due to multiple episodes of vomiting. Denies headache, visual changes, photophobia, cough, SOB, CP, urinary sxs, vaginal bleeding, vaginal d/c, numbness, tingling. Denies any sick contacts. LMP 4 weeks ago. Denies hx of abdominal surgeries.   Past Medical History  Diagnosis Date  . Asthma     prn inhaler  . History of ventricular septal defect     closed prior to age 19, per mother  . Heart murmur     functional murmur, per echo 04/2006  . Sinus infection 03/26/2014    will finish antibiotic 03/30/2014  . History of anemia     no current med.  . Closed fracture nasal bone 03/22/2014  . Laceration of nose 03/22/2014    x 3 - sutured  . Pneumonia    Past Surgical History  Procedure Laterality Date  . Closed reduction nasal fracture N/A 03/31/2014    Procedure: CLOSED REDUCTION  NASAL FRACTURE;  Surgeon: Glenna FellowsBrinda Thimmappa, MD;  Location: [facility];  Service: Plastics;  Laterality: N/A;  . Wisdom tooth extraction     Family History  Problem Relation Age of Onset  . Hypertension Maternal Grandmother   . Congestive Heart Failure Maternal Grandmother   . Asthma Maternal Grandmother   . Stroke Maternal Grandmother   . Hypertension Paternal Grandmother   . Asthma Mother    Social History  Substance Use Topics  . Smoking status: Never Smoker   . Smokeless tobacco: Never Used  . Alcohol Use: No   OB History    No data available     Review of Systems  Constitutional: Positive for chills and fatigue.  HENT: Positive for sore throat.   Gastrointestinal: Positive for nausea, vomiting, abdominal pain, diarrhea and blood in stool.  Musculoskeletal: Positive for myalgias, back pain and neck pain.  Neurological: Positive for weakness.  All other systems reviewed and are negative.     Allergies  Review of patient's allergies indicates no known allergies.  Home Medications   Prior to Admission medications   Medication Sig Start Date End Date Taking? Authorizing Provider  acetaminophen (TYLENOL) 325 MG tablet Take 650 mg by mouth every 6 (six) hours as needed for mild pain, moderate pain or headache.    Yes Historical Provider, MD  albuterol (PROVENTIL HFA;VENTOLIN HFA) 108 (90 BASE) MCG/ACT inhaler Inhale 1 puff into the lungs every 6 (six) hours as needed for wheezing or shortness of  breath.   Yes Historical Provider, MD  albuterol (PROVENTIL) (2.5 MG/3ML) 0.083% nebulizer solution Take 3 mLs (2.5 mg total) by nebulization every 6 (six) hours as needed for wheezing or shortness of breath. 07/04/15  Yes Everlene Farrier, PA-C  cephALEXin (KEFLEX) 500 MG capsule Take 500 mg by mouth 3 (three) times daily.   Yes Historical Provider, MD  FALMINA 0.1-20 MG-MCG tablet Take 1 tablet by mouth at bedtime. 06/17/15  Yes Historical Provider, MD   Fluticasone-Salmeterol (ADVAIR) 250-50 MCG/DOSE AEPB Inhale 1 puff into the lungs 2 (two) times daily.   Yes Historical Provider, MD  montelukast (SINGULAIR) 10 MG tablet Take 10 mg by mouth at bedtime.   Yes Historical Provider, MD  amoxicillin (AMOXIL) 500 MG capsule Take 2 capsules (1,000 mg total) by mouth daily. 08/10/15   Barrett Henle, PA-C  benzonatate (TESSALON) 100 MG capsule Take 1 capsule (100 mg total) by mouth 3 (three) times daily as needed for cough. Patient not taking: Reported on 08/09/2015 07/04/15   Everlene Farrier, PA-C  ondansetron (ZOFRAN ODT) 4 MG disintegrating tablet Take 1 tablet (4 mg total) by mouth every 8 (eight) hours as needed for nausea or vomiting. 08/10/15   Satira Sark Oval Cavazos, PA-C   BP 119/72 mmHg  Pulse 94  Temp(Src) 98.9 F (37.2 C) (Oral)  Resp 18  Ht  (1.575 m)  Wt 58.514 kg  BMI 23.59 kg/m2  SpO2 98%  LMP 07/15/2015 Physical Exam  Constitutional: She is oriented to person, place, and time. She appears well-developed and well-nourished.  HENT:  Head: Normocephalic and atraumatic.  Right Ear: Tympanic membrane normal.  Left Ear: Tympanic membrane normal.  Nose: Nose normal.  Mouth/Throat: Uvula is midline. Mucous membranes are dry. Posterior oropharyngeal erythema present. No oropharyngeal exudate or posterior oropharyngeal edema.  Eyes: Conjunctivae and EOM are normal. Pupils are equal, round, and reactive to light. Right eye exhibits no discharge. Left eye exhibits no discharge. No scleral icterus.  Neck: Normal range of motion. Neck supple.  Cardiovascular: Normal rate, regular rhythm and intact distal pulses.   Murmur heard.  Systolic murmur is present  Pulmonary/Chest: Effort normal and breath sounds normal. No respiratory distress. She has no wheezes. She has no rales. She exhibits no tenderness.  Abdominal: Soft. Bowel sounds are normal. She exhibits no distension and no mass. There is tenderness (mild diffuse  abdominal tenderness, worse in epigastric region). There is no rebound and no guarding.  Musculoskeletal: Normal range of motion. She exhibits no edema.  Lymphadenopathy:    She has no cervical adenopathy.  Neurological: She is alert and oriented to person, place, and time.  Skin: Skin is warm and dry. No rash noted.  Nursing note and vitals reviewed.   ED Course  Procedures (including critical care time) Labs Review Labs Reviewed  COMPREHENSIVE METABOLIC PANEL - Abnormal; Notable for the following:    Sodium 134 (*)    Chloride 100 (*)    Glucose, Bld 123 (*)    All other components within normal limits  RAPID STREP SCREEN (NOT AT [facility])  GASTROINTESTINAL PANEL BY PCR, STOOL (REPLACES STOOL CULTURE)  CULTURE, GROUP A STREP  LIPASE, BLOOD  CBC  URINALYSIS, ROUTINE W REFLEX MICROSCOPIC (NOT AT [facility])  I-STAT BETA HCG BLOOD, ED ([facility], [facility], [facility] ONLY)    Imaging Review No results found. I have personally reviewed and evaluated these images and lab results as part of my medical decision-making.   EKG Interpretation None  MDM   Final diagnoses:  Viral gastroenteritis  Tonsillitis    Pt presents with sore throat that started 3 days ago and abdominal pain, N/V/D and myalgias that started last night. She has been unable to tolerate PO at home. Denies sick contacts. She was dx with tonsillitis 1 week ago at campus clinic and reports to not completing her antibiotic course. VSS. Exam revealed dry mucous membranes, erythematous peritonsillar region, mild diffuse abdominal pain, no peritoneal signs and systolic murmur. Pt reports being dx with systolic murmur years ago and notes she had a negative workup by pediatric cardiology. No trismus, drooling, neck swelling or stridor on exam. Pt given IVF, pain meds and zofran. Labs and UA unremarkable.   Dr. Ranae Palms evaluated pt. No meningeal signs on exam. Will order rapid strep due to hx of sore thraot and erythematous tonsils. Pt reports  she is feeling much better and her N/V and abdominal pain have significantly improved. She denies having any bowel movements while in the ED. Pt has been able to tolerate PO. Pt requesting to be d/c home. Advised pt that her rapid strep is pending however pt and mother still requesting to go home. Due to pt's hx of recent tonsillitis without finishing antibiotic and presenting today with erythematous tonsils will d/c pt home with amoxicillin to tx for tonsillitis and zofran for nausea. Discussed strict return precautions with pt. Advised pt to follow up with PCP.    Satira Sark [place], [place] 08/10/15 0454  Loren Racer, MD 08/19/15 1600

## 2015-08-09 NOTE — ED Notes (Signed)
Pt presents with abd pain, low back pain, emesis and diarrhea onset last night, dizziness and weakness.

## 2015-08-10 LAB — RAPID STREP SCREEN (MED CTR MEBANE ONLY): STREPTOCOCCUS, GROUP A SCREEN (DIRECT): NEGATIVE

## 2015-08-10 MED ORDER — AMOXICILLIN 500 MG PO CAPS: 1000.0000 mg | ORAL_CAPSULE | Freq: Every day | ORAL | Status: AC

## 2015-08-10 MED ORDER — ONDANSETRON 4 MG PO TBDP: 4.0000 mg | ORAL_TABLET | Freq: Three times a day (TID) | ORAL | Status: DC | PRN

## 2015-08-10 NOTE — Discharge Instructions (Signed)
Take your medications as prescribed. Continue drinking fluids to remain hydrated. Follow-up with your primary care provider in 3-4 days. Return to the emergency department if symptoms worsen or new onset of fever, headache, neck stiffness, productive cough, difficulty breathing, chest pain, vomiting blood, blood in stool, numbness, tingling, weakness, syncope, seizure.

## 2015-08-13 LAB — CULTURE, GROUP A STREP: Strep A Culture: NEGATIVE

## 2016-06-29 HISTORY — PX: ANKLE SURGERY: SHX546

## 2016-11-24 IMAGING — US US PELVIS LIMITED
1 series · 12 of 12 positions shown · non-contrast
Comparison: None.

CLINICAL DATA: Palpable mass left lower back at L5 level

EXAM:
LIMITED ULTRASOUND OF ABDOMINAL SOFT TISSUES
TECHNIQUE: Ultrasound examination of the abdominal wall soft tissues was
performed in the area of clinical concern.

[Series 1: us pelvis limited · 0.07mm/px · 12 of 12 slices shown]
[im 1/12]
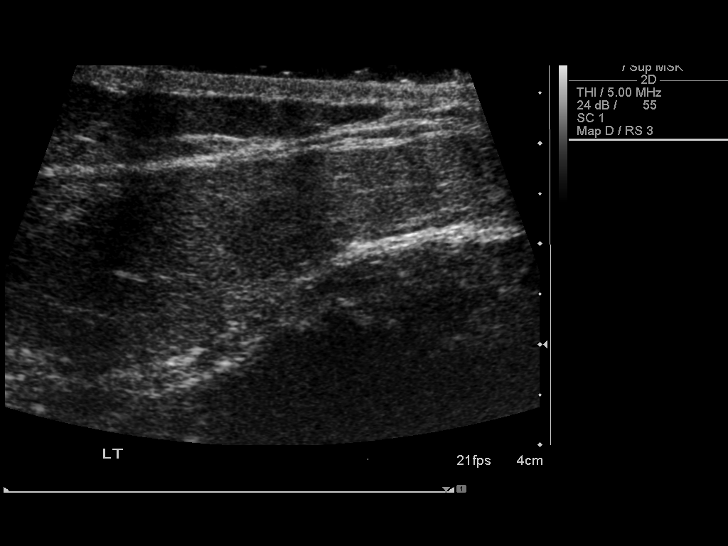
[im 2/12]
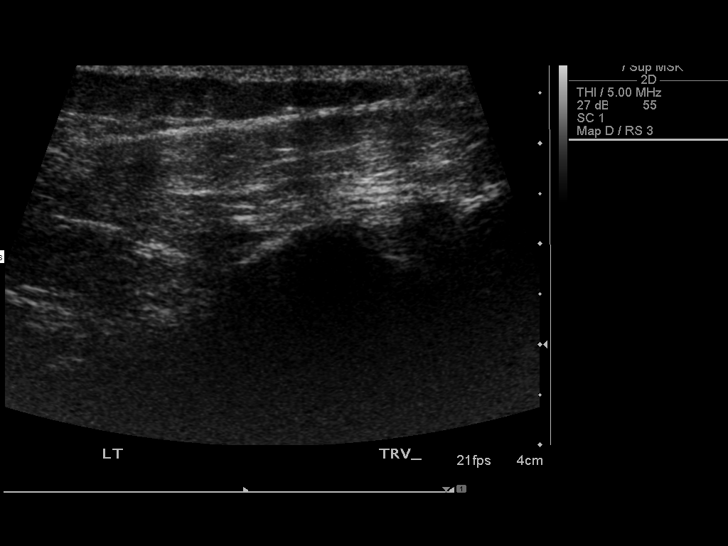
[im 3/12]
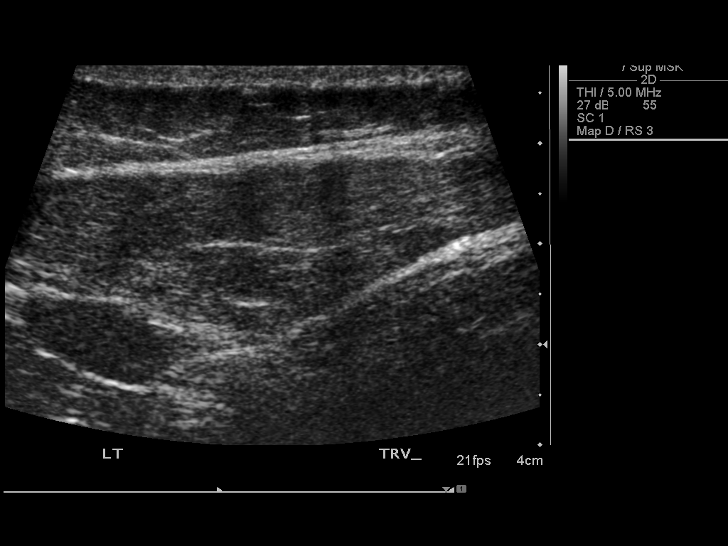
[im 4/12]
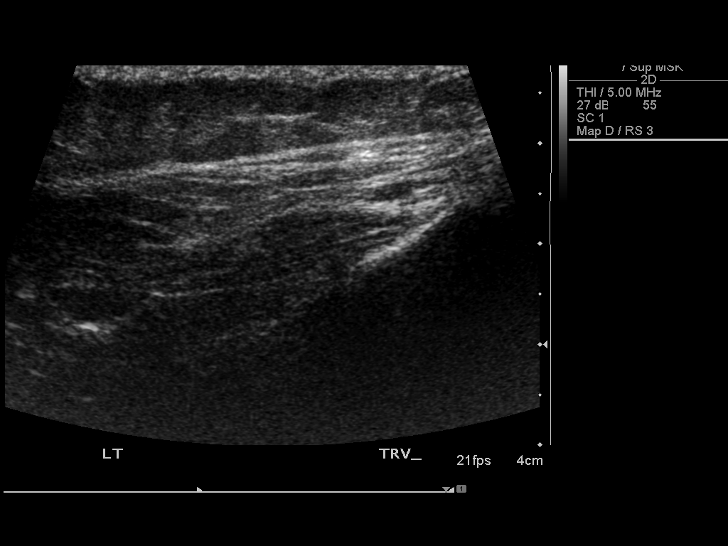
[im 5/12]
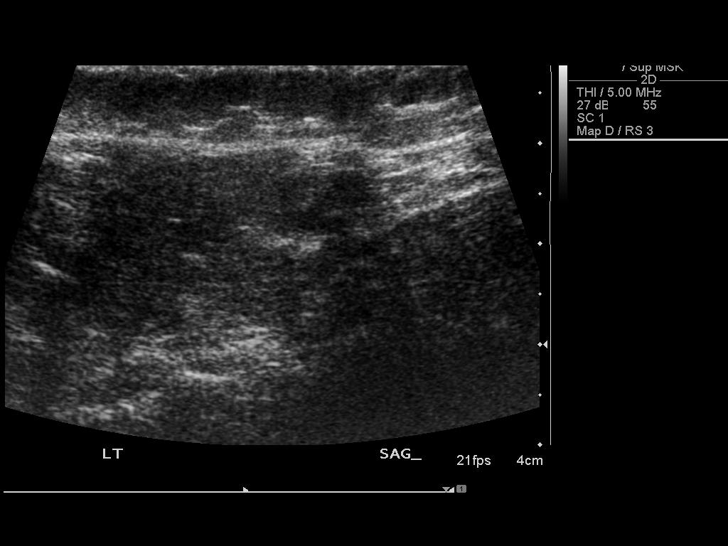
[im 6/12]
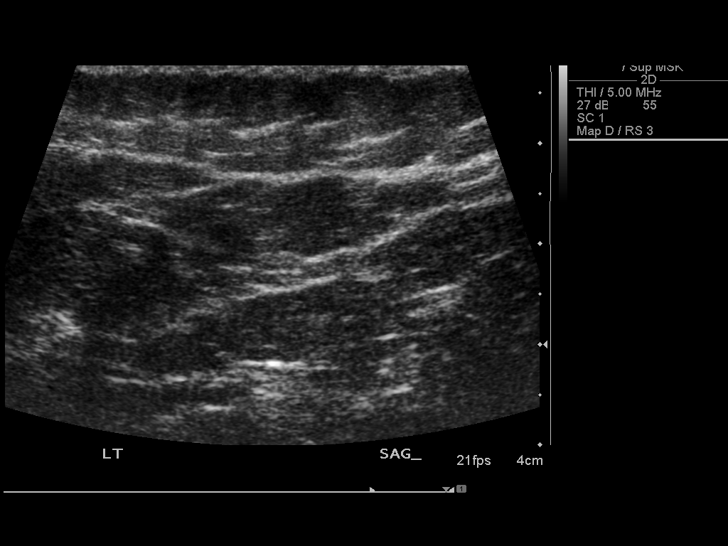
[im 7/12]
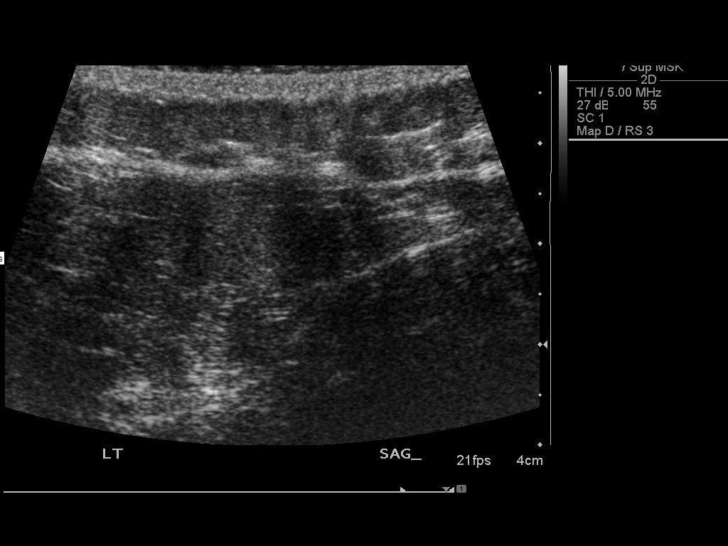
[im 8/12]
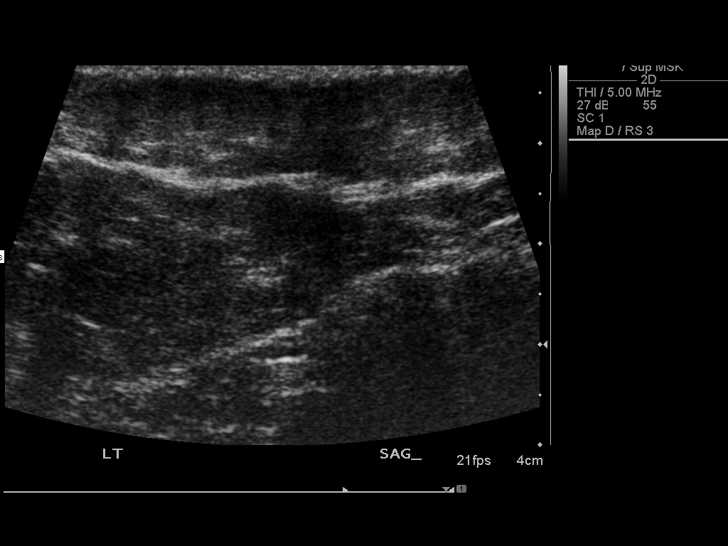
[im 9/12]
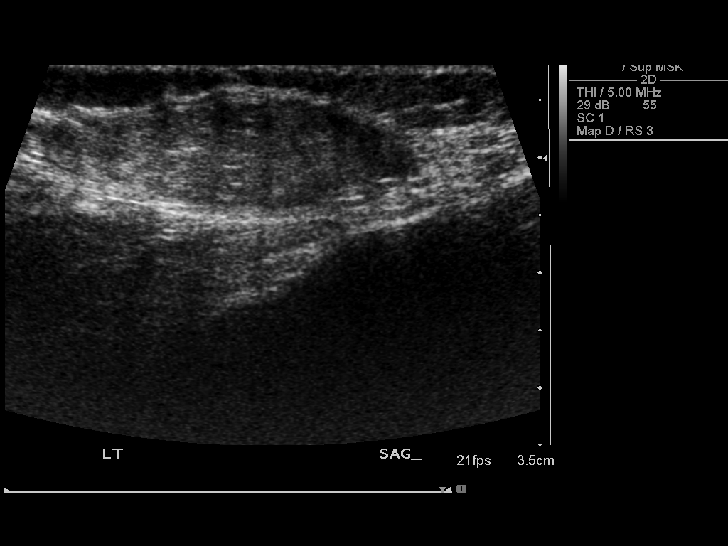
[im 10/12]
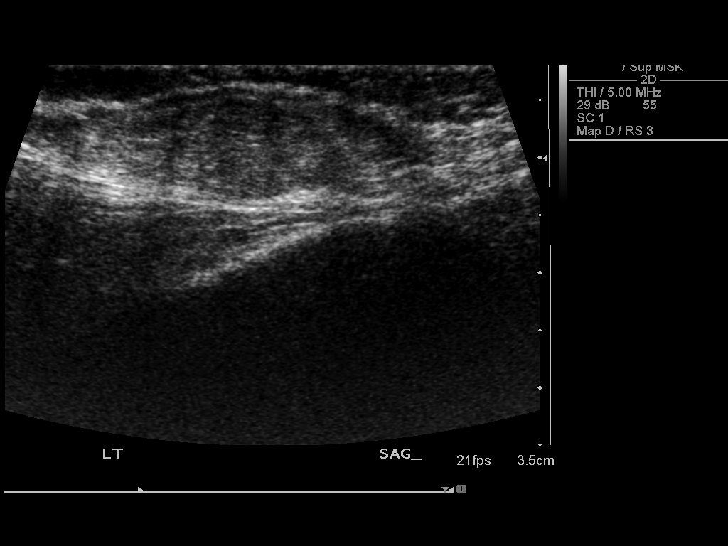
[im 11/12]
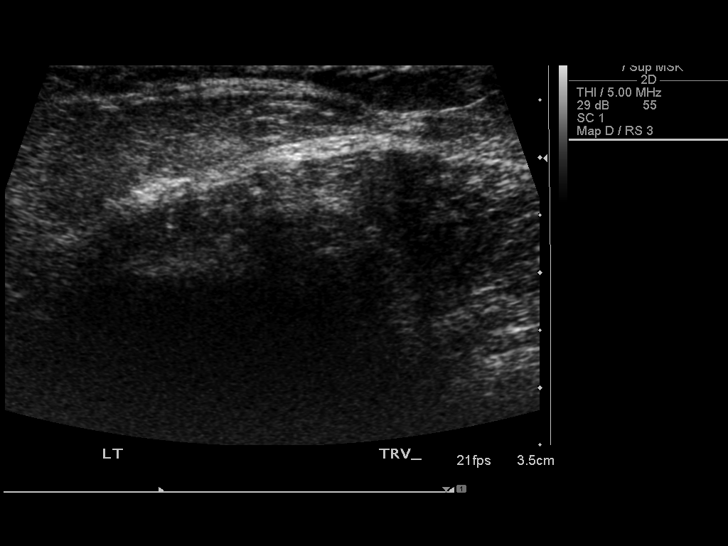
[im 12/12]
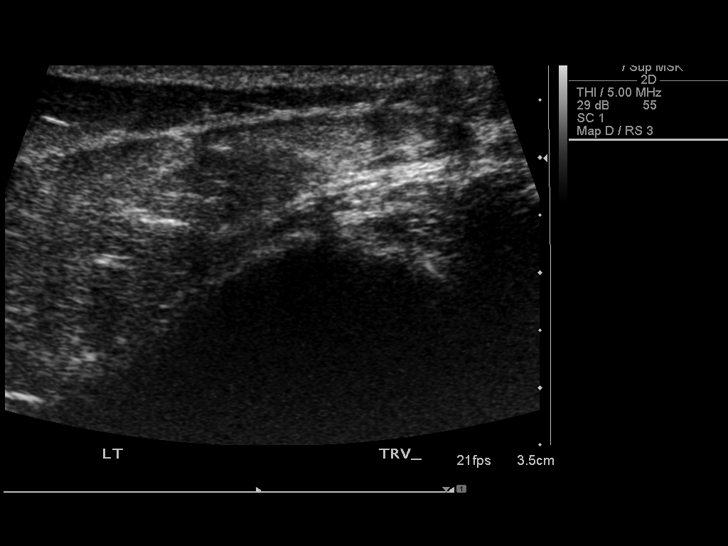

[12 of 12 positions shown; findings below may reference images not displayed]

FINDINGS: Targeted ultrasound was performed in the area of clinical concern
(left lower back at L5 level).

No cystic or solid mass is visualized.
IMPRESSION: No sonographic abnormality is seen in the area of clinical concern.

## 2017-07-28 IMAGING — CR DG CHEST 2V
2 series · 2 of 2 positions shown · non-contrast
Comparison: [DATE]

CLINICAL DATA: Shortness of breath, dizziness and wheezing.
Productive cough

EXAM:
CHEST  2 VIEW

[w chest pa]
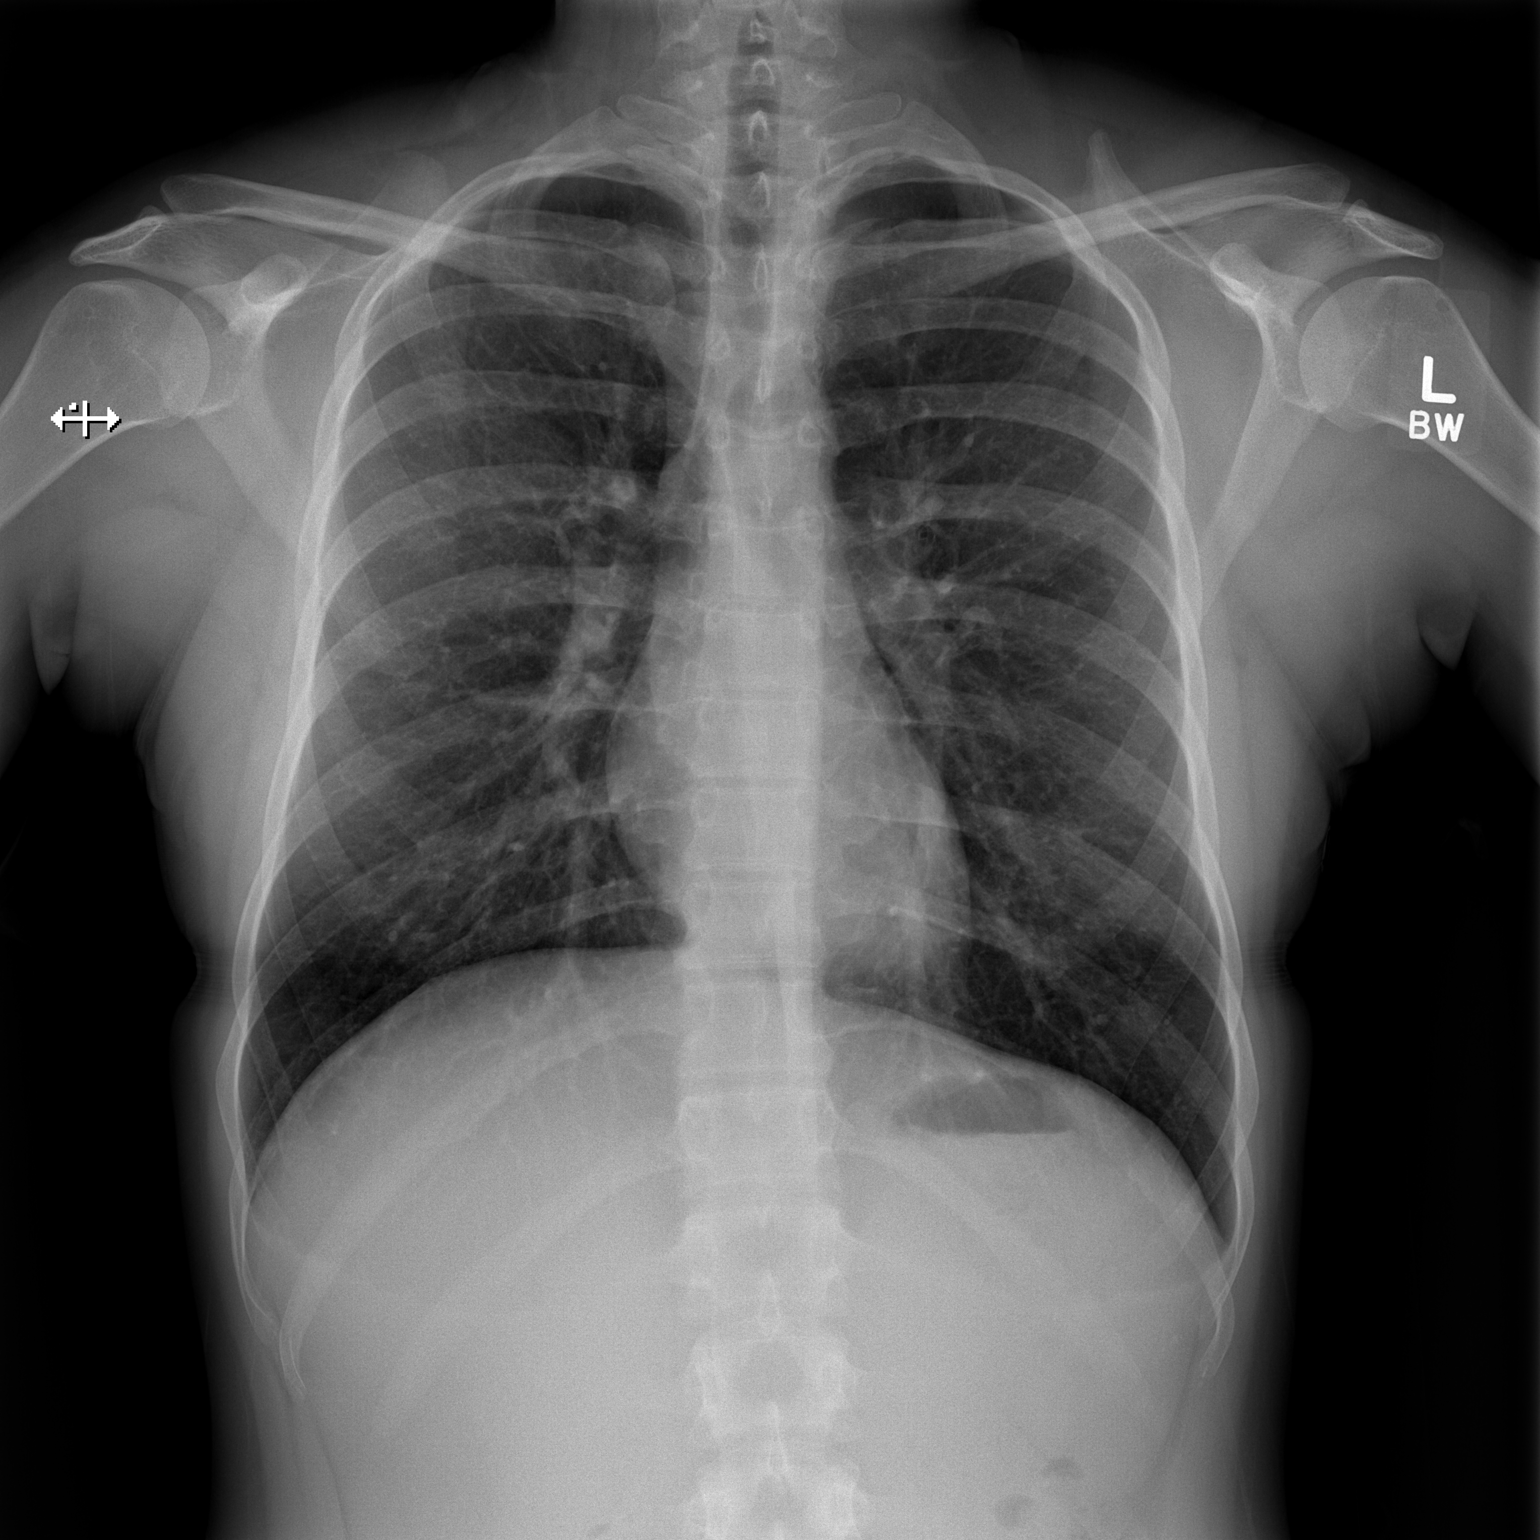

[w chest lat]
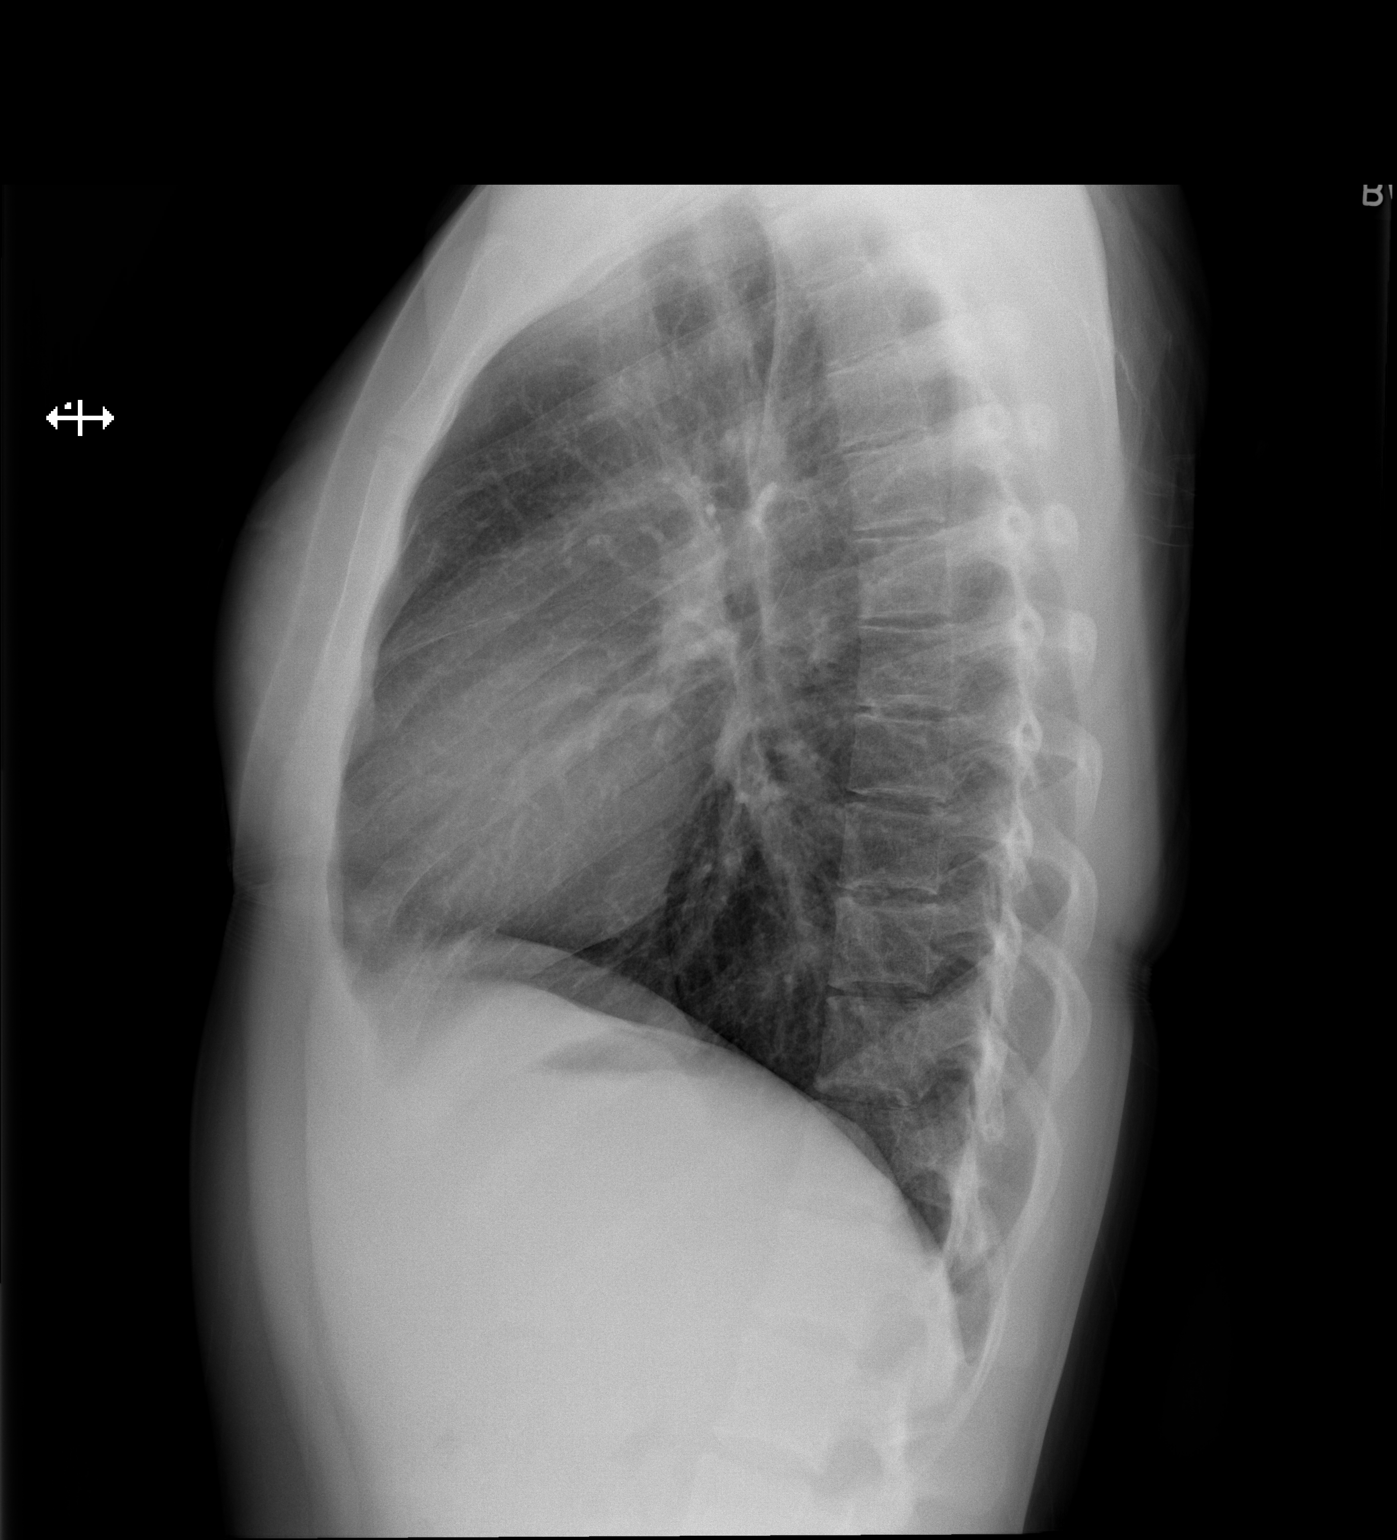

[2 of 2 positions shown; findings below may reference images not displayed]

FINDINGS: The heart size and mediastinal contours are within normal limits.
Both lungs are clear. The visualized skeletal structures are
unremarkable.
IMPRESSION: No active cardiopulmonary disease.

## 2017-08-25 ENCOUNTER — Ambulatory Visit (INDEPENDENT_AMBULATORY_CARE_PROVIDER_SITE_OTHER): Payer: 59 | Admitting: Allergy & Immunology

## 2017-08-25 ENCOUNTER — Encounter: Payer: Self-pay | Admitting: Allergy & Immunology

## 2017-08-25 VITALS — BP 122/100 | HR 72 | Temp 97.8°F | Resp 20 | Ht 62.25 in | Wt 134.0 lb

## 2017-08-25 DIAGNOSIS — T781XXD Other adverse food reactions, not elsewhere classified, subsequent encounter: Secondary | ICD-10-CM | POA: Diagnosis not present

## 2017-08-25 DIAGNOSIS — J4520 Mild intermittent asthma, uncomplicated: Secondary | ICD-10-CM

## 2017-08-25 DIAGNOSIS — J310 Chronic rhinitis: Secondary | ICD-10-CM | POA: Diagnosis not present

## 2017-08-25 DIAGNOSIS — E739 Lactose intolerance, unspecified: Secondary | ICD-10-CM

## 2017-08-25 DIAGNOSIS — J3089 Other allergic rhinitis: Secondary | ICD-10-CM | POA: Diagnosis not present

## 2017-08-25 NOTE — Patient Instructions (Addendum)
1. Mild intermittent asthma, uncomplicated - Lung testing looked amazing today. - I do not think that a controller medication is needed at this time. - Continue with albuterol two puffs every 4-6 hours as needed. - You can also pre-medicate with albuterol prior to intensive physical activity, if needed.  2. Adverse food reaction - Testing was negative to Soy, Wheat, Milk, Egg, Casein, Shellfish Mix, Fish Mix, Rice, Grape, Saccharomycese Cerevisiae, Barley, Oat and Rye  - We will get blood work to rule out an allergy to hops. - We will call you in 1-2 weeks with the results. - There is a the low positive predictive value of food allergy testing and hence the high possibility of false positives. - In contrast, food allergy testing has a high negative predictive value, therefore if testing is negative we can be relatively assured that they are indeed negative.  - This might be related to more of an intolerance rather than an allergy (therefore it is not life threatening).  3. Perennial allergic rhinitis - Testing was very reactive to cat with milder reactions to one grass and one mold. - Avoid cats as tolerated. - You can pre-medicate with a Zyrtec or two before going to your boyfriend's house to see if that helps. - Also try to avoid petting the cat and change clothes when you get home to avoid prolonged dander exposure  4. Return in about 6 months (around 02/22/2018) and bring pictures from your trip!     Please inform us of any Emergency Department visits, hospitalizations, or changes in symptoms. Call us before going to the ED for breathing or allergy symptoms since we might be able to fit you in for a sick visit. Feel free to contact us anytime with any questions, problems, or concerns.  It was a pleasure to meet you today! Happy New Year! Have a fantastic time in [place]!   Websites that have reliable patient information: 1. American Academy of Asthma, Allergy, and Immunology:  www.aaaai.org 2. Food Allergy Research and Education (FARE): foodallergy.org 3. Mothers of Asthmatics: http://www.asthmacommunitynetwork.org 4. American College of Allergy, Asthma, and Immunology: www.acaai.org   Reducing Pollen Exposure  The American Academy of Allergy, Asthma and Immunology suggests the following steps to reduce your exposure to pollen during allergy seasons.    1. Do not hang sheets or clothing out to dry; pollen may collect on these items. 2. Do not mow lawns or spend time around freshly cut grass; mowing stirs up pollen. 3. Keep windows closed at night.  Keep car windows closed while driving. 4. Minimize morning activities outdoors, a time when pollen counts are usually at their highest. 5. Stay indoors as much as possible when pollen counts or humidity is high and on windy days when pollen tends to remain in the air longer. 6. Use air conditioning when possible.  Many air conditioners have filters that trap the pollen spores. 7. Use a HEPA room air filter to remove pollen form the indoor air you breathe.  Control of Mold Allergen   Mold and fungi can grow on a variety of surfaces provided certain temperature and moisture conditions exist.  Outdoor molds grow on plants, decaying vegetation and soil.  The major outdoor mold, Alternaria and Cladosporium, are found in very high numbers during hot and dry conditions.  Generally, a late Summer - Fall peak is seen for common outdoor fungal spores.  Rain will temporarily lower outdoor mold spore count, but counts rise rapidly when the rainy period ends.  The most important indoor molds are Aspergillus and Penicillium.  Dark, humid and poorly ventilated basements are ideal sites for mold growth.  The next most common sites of mold growth are the bathroom and the kitchen.  Indoor (Perennial) Mold Control   Positive indoor molds via skin testing: Phoma  1. Maintain humidity below 50%. 2. Clean washable surfaces with 5% bleach  solution. 3. Remove sources e.g. contaminated carpets.      Control of Dog or Cat Allergen  Avoidance is the best way to manage a dog or cat allergy. If you have a dog or cat and are allergic to dog or cats, consider removing the dog or cat from the home. If you have a dog or cat but don't want to find it a new home, or if your family wants a pet even though someone in the household is allergic, here are some strategies that may help keep symptoms at bay:  1. Keep the pet out of your bedroom and restrict it to only a few rooms. Be advised that keeping the dog or cat in only one room will not limit the allergens to that room. 2. Don't pet, hug or kiss the dog or cat; if you do, wash your hands with soap and water. 3. High-efficiency particulate air (HEPA) cleaners run continuously in a bedroom or living room can reduce allergen levels over time. 4. Regular use of a high-efficiency vacuum cleaner or a central vacuum can reduce allergen levels. 5. Giving your dog or cat a bath at least once a week can reduce airborne allergen.

## 2017-08-25 NOTE — Progress Notes (Addendum)
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 Mild intermittent asthma, uncomplicated - Lung testing looked amazing today. - I do not think that a controller medication is needed at this time. - Continue with albuterol two puffs every 4-6 hours as needed. - You can also pre-medicate with albuterol prior to intensive physical activity, if needed.  2. Adverse food reaction - Testing was negative to Soy, Wheat, Milk, Egg, Casein, Shellfish Mix, Fish Mix, Rice, Grape, Saccharomycese Cerevisiae, Barley, Oat and Rye  - We will get blood work to rule out an allergy to hops. - We will call you in 1-2 weeks with the results. - There is a the low positive predictive value of food allergy testing and hence the high possibility of false positives. - In contrast, food allergy testing has a high negative predictive value, therefore if testing is negative we can be relatively assured that they are indeed negative.  - This might be related to more of an intolerance rather than an allergy (therefore it is not life threatening). - She does report today that she can eat small amounts of wheat without problems.   3. Perennial allergic rhinitis - Testing was very reactive to cat with milder reactions to one grass and one mold. - Avoid cats as tolerated. - You can pre-medicate with a Zyrtec or two before going to your boyfriend's house to see if that helps. - Also try to avoid petting the cat and change clothes when you get home to avoid prolonged dander exposure  4. Return in about 6 months (around 02/22/2018).  Subjective:   Jaime Wiggins is a 22 y.o. female presenting today for evaluation of  Chief  Complaint  Patient presents with  . Allergies    gluten  . Abdominal Cramping    after drinking beer  . Asthma    exercise induced     Jaime Wiggins has a history of the following: Patient Active Problem List   Diagnosis Date Noted  . Perennial allergic rhinitis 08/25/2017  . Mild intermittent asthma, uncomplicated 89/16/9450    History obtained from: chart review and patient.  Leodis Liverpool was referred by Dene Gentry, MD.     Jaime Wiggins is a 22 y.o. female presenting for concern for food allergies. She does have a history of abdominal pain with lactose exposure. She has used Lactaid pills in the past. She still eats cow's milk products anyway. She mostly ingests ice cream and cheese. She does not like yogurt and cow's milk.  Over the past couple of years, she develops swelling with exposure to beer. The one that triggered it was very hoppy. She describes pain and swelling with cramping. She develops constipation. Symptoms last for around November 2018 during her birthday. She also ate chicken and potatoes as well, but she has tolerated this since that time. She does eat moderate amounts of bread with some mild problems with the bloating. The night in November was the worst. She has not had any beer since that time. She does not eat pasta often. She did not have hives, throat swelling, or itching at all. She did not treat it with anything in particular. She also reports stomach pain with Hibachi and exposure  to [employer].   Jaime Wiggins does have a history of exercise induced asthma. She did have RSV as an infant. Overall it is well controlled. She does have albuterol to use as needed. She uses it when she gets a cold. It takes her months to go through an albuterol inhaler. She has not needed prednisone for breathing at any point in particular. She denies night time coughing and wheezing.   Jaime Wiggins has never been treated for allergies, although she does report that she was told to take Zyrtec  as needed. She denies rhinorrhea, congestion, itchy watery eyes, and sneezing. She did have problems with her immune system over the past couple of years with recurrent Strep, but this has since cleared. She has been better over the last academic year, especially since she has gotten her own room.  Otherwise, there is no history of other atopic diseases, including drug allergies, stinging insect allergies, or urticaria. She did have eczema when she was younger, but this seems to have cleared up. There is no significant infectious history. Vaccinations are up to date.    Past Medical History: Patient Active Problem List   Diagnosis Date Noted  . Perennial allergic rhinitis 08/25/2017  . Mild intermittent asthma, uncomplicated 31/49/7026    Medication List:  Allergies as of 08/25/2017      Reactions   Lactose Intolerance (gi)       Medication List        Accurate as of 08/25/17  9:58 AM. Always use your most recent med list.          acetaminophen 325 MG tablet Commonly known as:  TYLENOL Take 650 mg by mouth every 6 (six) hours as needed for mild pain, moderate pain or headache.   albuterol 108 (90 Base) MCG/ACT inhaler Commonly known as:  PROVENTIL HFA;VENTOLIN HFA Inhale 1 puff into the lungs every 6 (six) hours as needed for wheezing or shortness of breath.   albuterol (2.5 MG/3ML) 0.083% nebulizer solution Commonly known as:  PROVENTIL Take 3 mLs (2.5 mg total) by nebulization every 6 (six) hours as needed for wheezing or shortness of breath.   amoxicillin 500 MG capsule Commonly known as:  AMOXIL Take 2 capsules (1,000 mg total) by mouth daily.   amphetamine-dextroamphetamine 10 MG 24 hr capsule Commonly known as:  ADDERALL XR   FALMINA 0.1-20 MG-MCG tablet Generic drug:  levonorgestrel-ethinyl estradiol Take 1 tablet by mouth at bedtime.   Fluticasone-Salmeterol 250-50 MCG/DOSE Aepb Commonly known as:  ADVAIR Inhale 1 puff into the lungs 2 (two) times daily.     montelukast 10 MG tablet Commonly known as:  SINGULAIR Take 10 mg by mouth at bedtime.   propranolol 10 MG tablet Commonly known as:  INDERAL       Birth History: non-contributory. Born at term without complications. She is a twin gestation.   Developmental History: Jaime Wiggins has met all milestones on time. She has required no speech therapy, occupational therapy, or physical therapy.   Past Surgical History: Past Surgical History:  Procedure Laterality Date  . ANKLE SURGERY Right 06/29/2016  . CLOSED REDUCTION NASAL FRACTURE N/A 03/31/2014   Procedure: CLOSED REDUCTION NASAL FRACTURE;  Surgeon: Irene Limbo, MD;  Location: [facility];  Service: Plastics;  Laterality: N/A;  . WISDOM TOOTH EXTRACTION     She did have an ankle surgery last year secondary to an ankle injury.  Family History: Family History  Problem Relation Age of Onset  . Hypertension Maternal Grandmother   .  Congestive Heart Failure Maternal Grandmother   . Asthma Maternal Grandmother   . Stroke Maternal Grandmother   . Asthma Mother   . Sinusitis Father   . Hypertension Paternal Grandmother   . Sinusitis Brother   . Allergic rhinitis Neg Hx   . Eczema Neg Hx   . Urticaria Neg Hx   . Immunodeficiency Neg Hx   . Angioedema Neg Hx      Social History: Jaime Wiggins lives at home during the school break with her twin brother, Mom, and Dad. They live in a 50yo home with wood flooring throughout the home. They have electric heating and centra cooling. There are dogs in the home, which have never seemed to have bothered Jaime Wiggins. There are dust mite coverings on the bed, but not the pillows. There is no smoking in home. She is leaving in two weeks to spend a semester in [place]. She attends the [employer] in [place]. She is Chemical engineer in Pharmacologist. She is interested in looking into the fashion industry.      Review of Systems: a 14-point review of systems is pertinent for what is mentioned in  HPI.  Otherwise, all other systems were negative. Constitutional: negative other than that listed in the HPI Eyes: negative other than that listed in the HPI Ears, nose, mouth, throat, and face: negative other than that listed in the HPI Respiratory: negative other than that listed in the HPI Cardiovascular: negative other than that listed in the HPI Gastrointestinal: negative other than that listed in the HPI Genitourinary: negative other than that listed in the HPI Integument: negative other than that listed in the HPI Hematologic: negative other than that listed in the HPI Musculoskeletal: negative other than that listed in the HPI Neurological: negative other than that listed in the HPI Allergy/Immunologic: negative other than that listed in the HPI    Objective:   Blood pressure (!) 122/100, pulse 72, temperature 97.8 F (36.6 C), temperature source Oral, resp. rate 20, height 5' 2.25" (1.581 m), weight 134 lb (60.8 kg), SpO2 98 %. Body mass index is 24.31 kg/m.   Physical Exam:  General: Alert, interactive, in no acute distress. Pleasant female.  Eyes: No conjunctival injection bilaterally, no discharge on the right, no discharge on the left and no Horner-Trantas dots present. PERRL bilaterally. EOMI without pain. No photophobia.  Ears: Right TM pearly gray with normal light reflex, Left TM pearly gray with normal light reflex, Right TM intact without perforation and Left TM intact without perforation.  Nose/Throat: External nose within normal limits and septum midline. Turbinates edematous and pale with clear discharge. Posterior oropharynx mildly erythematous without cobblestoning in the posterior oropharynx. Tonsils 2+ without exudates.  Tongue without thrush. Neck: Supple without thyromegaly. Trachea midline. Adenopathy: no enlarged lymph nodes appreciated in the anterior cervical, occipital, axillary, epitrochlear, inguinal, or popliteal regions. Lungs: Clear to  auscultation without wheezing, rhonchi or rales. No increased work of breathing. CV: Normal S1/S2. No murmurs. Capillary refill <2 seconds.  Abdomen: Nondistended, nontender. No guarding or rebound tenderness. Bowel sounds present in all fields and hyperactive  Skin: Warm and dry, without lesions or rashes. Extremities:  No clubbing, cyanosis or edema. Neuro:   Grossly intact. No focal deficits appreciated. Responsive to questions.  Diagnostic studies:   Spirometry: results normal (FEV1: 3.15/101%, FVC: 3.79/108%, FEV1/FVC: 83%).    Spirometry consistent with normal pattern.  Allergy Studies:   Indoor/Outdoor Percutaneous Adult Environmental Panel: positive to sweet vernal grass, Phoma and cat. The cat  in particular was quite large. Otherwise negative with adequate controls.   Full Food Panel: Negative to Soy, Wheat, Milk, Egg, Casein, Shellfish Mix, Fish Mix, Rice, Grape, Saccharomycese Cerevisiae, Barley, Oat and Rye        Salvatore Marvel, MD [facility]

## 2017-08-28 LAB — ALLERGEN, HOPS/FRUIT CONE, IGE: Hops: <0.1 kU/L

## 2017-08-30 ENCOUNTER — Telehealth: Payer: Self-pay | Admitting: *Deleted

## 2017-08-30 DIAGNOSIS — T782XXD Anaphylactic shock, unspecified, subsequent encounter: Secondary | ICD-10-CM

## 2017-08-30 NOTE — Telephone Encounter (Signed)
Patient called in regards to lab results states she cannot get in mychart to view results. Advised patient of results patient states that reactions have happened when she eats but might have alcohol at the same time. Dr Dellis AnesGallagher please advise on next step pt aware out of the office until next week

## 2017-08-31 NOTE — Telephone Encounter (Signed)
I would continue to avoid beer since the reactions have occurred mostly in association with this beverage. She is traveling to [place], so I would recommend that she try some wine before she goes at home with her [employer] close at hand. If she is fine with that, I would give her the blessing to drink wine. Once she gets back in the summer, we can regroup.   Also encourage her to take a diary of exposures, food and otherwise, so that we may be able to correlate her symptoms with exposures. We could also refer to GI if she is interested.  I had meant to order a serum tryptase to rule out mast cell disease. Order placed. Please call patient and ask her to come by the office for this test. That would be the only other serious cause of her issues.   Malachi BondsJoel Kamile Fassler, MD [facility]

## 2017-09-01 NOTE — Telephone Encounter (Signed)
My chart message sent as well as a voicemail advising of this and to call back with any further questions.

## 2017-12-29 ENCOUNTER — Other Ambulatory Visit: Payer: Self-pay | Admitting: Physician Assistant

## 2017-12-29 DIAGNOSIS — G8929 Other chronic pain: Secondary | ICD-10-CM

## 2017-12-29 DIAGNOSIS — R1032 Left lower quadrant pain: Secondary | ICD-10-CM

## 2017-12-29 DIAGNOSIS — R1031 Right lower quadrant pain: Principal | ICD-10-CM

## 2018-01-09 ENCOUNTER — Ambulatory Visit
Admission: RE | Admit: 2018-01-09 | Discharge: 2018-01-09 | Disposition: A | Discharge status: Home / Self Care | Payer: 59 | Source: Ambulatory Visit | Attending: Physician Assistant | Admitting: Physician Assistant

## 2018-01-09 DIAGNOSIS — R1031 Right lower quadrant pain: Principal | ICD-10-CM

## 2018-01-09 DIAGNOSIS — G8929 Other chronic pain: Secondary | ICD-10-CM

## 2018-01-09 DIAGNOSIS — R1032 Left lower quadrant pain: Secondary | ICD-10-CM

## 2018-01-09 MED ORDER — IOPAMIDOL (ISOVUE-300) INJECTION 61%
100.0000 mL | Freq: Once | INTRAVENOUS | Status: AC | PRN
  Administered 2018-01-09: 100 mL via INTRAVENOUS

## 2018-07-23 ENCOUNTER — Other Ambulatory Visit: Payer: Self-pay | Admitting: Physician Assistant

## 2018-07-23 DIAGNOSIS — R1013 Epigastric pain: Secondary | ICD-10-CM

## 2018-07-23 DIAGNOSIS — R0989 Other specified symptoms and signs involving the circulatory and respiratory systems: Secondary | ICD-10-CM

## 2018-07-23 DIAGNOSIS — K219 Gastro-esophageal reflux disease without esophagitis: Secondary | ICD-10-CM

## 2018-07-30 ENCOUNTER — Other Ambulatory Visit: Payer: 59

## 2018-11-13 ENCOUNTER — Telehealth: Payer: 59 | Admitting: Nurse Practitioner

## 2018-11-13 DIAGNOSIS — Z20822 Contact with and (suspected) exposure to covid-19: Secondary | ICD-10-CM

## 2018-11-13 DIAGNOSIS — R6889 Other general symptoms and signs: Principal | ICD-10-CM

## 2018-11-13 MED ORDER — BENZONATATE 100 MG PO CAPS: 100.0000 mg | ORAL_CAPSULE | Freq: Three times a day (TID) | ORAL | 0 refills | Status: AC | PRN

## 2018-11-13 NOTE — Addendum Note (Signed)
Addended by: Bennie Pierini on: 11/13/2018 06:10 PM   Modules accepted: Orders

## 2018-11-13 NOTE — Progress Notes (Signed)
I can call in tessalon perles for your cough but you will have to use motrin or tylenol for fever. Orders Placed This Encounter     benzonatate (TESSALON PERLES) 100 MG capsule         Sig: Take 1 capsule (100 mg total) by mouth 3 (three) times daily as needed.         Dispense:  20 capsule         Refill:  0         Order Specific Question: Supervising Provider         Answer: Eber Hong [[id number]]

## 2018-11-13 NOTE — Progress Notes (Signed)
  E-Visit for [employer] Virus Screening  Based on what you have shared with me, you need to seek an evaluation for a severe illness that is causing your symptoms which may be coronavirus or some other illness. I recommend that you be seen and evaluated "face to face". Our Emergency Departments are best equipped to handle patients with severe symptoms.  Due to your high fever and SOB I fel that you need to go to the ER   I recommend the following:  . If you are having a true medical emergency please call 911. . If you are considered high risk for Corona virus because of a known exposure, fever, shortness of breath and cough, OR if you have severe symptoms of any kind, seek medical care at an emergency room.  . Please call ahead and tell them that you were seen by telemedicine and they have recommended that you have a face to face evaluation. [facility] Emergency Department [address] [place], [place], [place] [address] [phone]  . [facility] Emergency Department [address] [place], [place] [address] [phone]  . [facility] Emergency Department [address] [place], [place], [place] [address] [phone]  . [facility] Emergency Department [address] [place], [place], [place] [address] [phone]  . [facility] Emergency Department [address] [place], [place], [place] [address] [phone]  NOTE: If you entered your credit card information for this eVisit, you will not be charged. You may see a "hold" on your card for the $35 but that hold will drop off and you will not have a charge processed.   Your e-visit answers were reviewed by a board certified advanced clinical practitioner to complete your personal care plan.  Thank you for using e-Visits. 5 minutes spent reviewing and documenting in chart.

## 2019-08-13 ENCOUNTER — Ambulatory Visit: Payer: 59 | Attending: Internal Medicine

## 2019-08-13 DIAGNOSIS — Z20822 Contact with and (suspected) exposure to covid-19: Secondary | ICD-10-CM

## 2019-08-14 LAB — NOVEL CORONAVIRUS, NAA: SARS-CoV-2, NAA: NOT DETECTED
